# Patient Record
Sex: Female | Born: 1955 | Race: White | Hispanic: No | Marital: Married | Smoking: Former smoker
Health system: Southern US, Community
[De-identification: ages and names within clinical notes are randomized; demographics above are authoritative.]

## PROBLEM LIST (undated history)

## (undated) DIAGNOSIS — I10 Essential (primary) hypertension: Secondary | ICD-10-CM

## (undated) DIAGNOSIS — E7800 Pure hypercholesterolemia, unspecified: Secondary | ICD-10-CM

## (undated) DIAGNOSIS — F419 Anxiety disorder, unspecified: Secondary | ICD-10-CM

## (undated) DIAGNOSIS — K219 Gastro-esophageal reflux disease without esophagitis: Secondary | ICD-10-CM

## (undated) DIAGNOSIS — G039 Meningitis, unspecified: Secondary | ICD-10-CM

## (undated) DIAGNOSIS — E559 Vitamin D deficiency, unspecified: Secondary | ICD-10-CM

## (undated) HISTORY — DX: Anxiety disorder, unspecified: F41.9

## (undated) HISTORY — DX: Essential (primary) hypertension: I10

## (undated) HISTORY — DX: Pure hypercholesterolemia, unspecified: E78.00

## (undated) HISTORY — PX: CHOLECYSTECTOMY: SHX55

## (undated) HISTORY — PX: WISDOM TOOTH EXTRACTION: SHX21

## (undated) HISTORY — DX: Gastro-esophageal reflux disease without esophagitis: K21.9

## (undated) HISTORY — PX: PELVIC FRACTURE SURGERY: SHX119

## (undated) HISTORY — DX: Vitamin D deficiency, unspecified: E55.9

---

## 2020-02-28 ENCOUNTER — Encounter (INDEPENDENT_AMBULATORY_CARE_PROVIDER_SITE_OTHER): Payer: Self-pay | Admitting: *Deleted

## 2020-05-10 ENCOUNTER — Encounter (INDEPENDENT_AMBULATORY_CARE_PROVIDER_SITE_OTHER): Payer: Self-pay

## 2020-05-10 ENCOUNTER — Other Ambulatory Visit: Payer: Self-pay

## 2020-05-10 ENCOUNTER — Telehealth (INDEPENDENT_AMBULATORY_CARE_PROVIDER_SITE_OTHER): Payer: Self-pay

## 2020-05-10 ENCOUNTER — Ambulatory Visit (INDEPENDENT_AMBULATORY_CARE_PROVIDER_SITE_OTHER): Payer: Self-pay | Admitting: Gastroenterology

## 2020-05-10 ENCOUNTER — Ambulatory Visit (INDEPENDENT_AMBULATORY_CARE_PROVIDER_SITE_OTHER): Admitting: Gastroenterology

## 2020-05-10 ENCOUNTER — Encounter (INDEPENDENT_AMBULATORY_CARE_PROVIDER_SITE_OTHER): Payer: Self-pay | Admitting: Gastroenterology

## 2020-05-10 ENCOUNTER — Other Ambulatory Visit (INDEPENDENT_AMBULATORY_CARE_PROVIDER_SITE_OTHER): Payer: Self-pay

## 2020-05-10 DIAGNOSIS — K219 Gastro-esophageal reflux disease without esophagitis: Secondary | ICD-10-CM | POA: Insufficient documentation

## 2020-05-10 DIAGNOSIS — R070 Pain in throat: Secondary | ICD-10-CM | POA: Diagnosis not present

## 2020-05-10 DIAGNOSIS — R1310 Dysphagia, unspecified: Secondary | ICD-10-CM | POA: Insufficient documentation

## 2020-05-10 MED ORDER — PEG 3350-KCL-NA BICARB-NACL 420 G PO SOLR: 4000.0000 mL | ORAL | 0 refills | Status: DC

## 2020-05-10 NOTE — Progress Notes (Signed)
Angela Mullins, M.D. Gastroenterology & Hepatology [facility] [address] [place], [place] [address] Primary Care Physician: Coolidge Breeze, FNP [place] [address] [place] [id number]  Referring MD: PCP  Chief Complaint: Dysphagia, throat discomfort and GERD  History of Present Illness. Angela Mullins is a 65 y.o. female with PMH anxiety, HTN, HLD, GERD, who presents for evaluation of dysphagia, throat discomfort and GERD.  Patient reports that since she was a teenager she has felt recurrent episodes of dysphagia when swallowing pills, very occasionally when swallowing solid food.  She reported that she tries to chew very well reported to avoid having any choking episodes.  However, she reported that she feels "strangled" when food goes occasionally as if there was some sort of pressure in her throat.  Does not have any symptoms when swallowing liquids.  More recently, for the last year she has presented episodes of feeling a foreign body in her throat as "if something was lodged in her throat".  She constantly clears her throat but reports no phlegm comes out.  Patient also reports having GERD and heartburn for the last 30 years.  She was initially put on Prilosec 20 mg every day but since she has persistent symptoms she was initially increased to 40 mg every day.  Reports that this controls her heartburn adequately as she rarely has episodes of burning sensation but she has constant regurgitation through the week, up to 5 times per week.  She tries to raise the head of the bed to decrease the symptoms.  She reports that when she has the episodes of regurgitation she takes Tums or Mylanta with some improvement.  The regurgitation is even worse when she takes coffee.  Patient reports significant history of anxiety and stress.  The patient denies having any odynophagia, nausea, vomiting, fever, chills, hematochezia, melena, hematemesis, abdominal  distention, abdominal pain, diarrhea, jaundice, pruritus or weight loss.  In fact, she has gained weight recently.  Last MWN:UUVOZ Last Colonoscopy:never  FHx: neg for any gastrointestinal/liver disease, aunts breast cancer Social: quit smoking 1 year ago, neg alcohol or illicit drug use Surgical: cholecystectomy  Past Medical History: Past Medical History:  Diagnosis Date  . Anxiety   . GERD (gastroesophageal reflux disease)   . Hypercholesteremia   . Hypertension   . Vitamin D deficiency     Past Surgical History: Past Surgical History:  Procedure Laterality Date  . CHOLECYSTECTOMY    . PELVIC FRACTURE SURGERY    . WISDOM TOOTH EXTRACTION      Family History: Family History  Problem Relation Age of Onset  . Alzheimer's disease Mother   . Heart disease Father   . Diabetes Sister   . Heart disease Sister   . Hypertension Brother     Social History: Social History   Tobacco Use  Smoking Status Former Smoker  Smokeless Tobacco Never Used  Tobacco Comment   Quit 2021   Social History   Substance and Sexual Activity  Alcohol Use Yes   Comment: occasionally   Social History   Substance and Sexual Activity  Drug Use Never    Allergies: Allergies  Allergen Reactions  . Quinine Derivatives Nausea And Vomiting    Medications: Current Outpatient Medications  Medication Sig Dispense Refill  . aspirin EC 81 MG tablet Take 81 mg by mouth daily. Swallow whole.    Marland Kitchen atorvastatin (LIPITOR) 80 MG tablet Take 80 mg by mouth daily.    Marland Kitchen  cetirizine (ZYRTEC ALLERGY) 10 MG tablet Take 10 mg by mouth daily.    . clonazePAM (KLONOPIN) 0.5 MG tablet Take 0.5 mg by mouth 2 (two) times daily as needed for anxiety.    . ergocalciferol (VITAMIN D2) 1.25 MG (50000 UT) capsule Take 50,000 Units by mouth once a week.    . fenofibrate micronized (LOFIBRA) 200 MG capsule Take 200 mg by mouth daily before breakfast.    . ibuprofen (ADVIL) 200 MG tablet Take 200 mg by mouth every  6 (six) hours as needed.    . lisinopril (ZESTRIL) 10 MG tablet Take 10 mg by mouth daily.    . Multiple Vitamins-Minerals (MULTIVITAMIN WITH MINERALS) tablet Take 1 tablet by mouth daily.    . omeprazole (PRILOSEC) 40 MG capsule Take 40 mg by mouth daily.    . Potassium 99 MG TABS Take by mouth daily.     No current facility-administered medications for this visit.    Review of Systems: GENERAL: negative for malaise, night sweats HEENT: No changes in hearing or vision, no nose bleeds or other nasal problems. NECK: Negative for lumps, goiter, pain and significant neck swelling RESPIRATORY: Negative for cough, wheezing CARDIOVASCULAR: Negative for chest pain, leg swelling, palpitations, orthopnea GI: SEE HPI MUSCULOSKELETAL: Negative for joint pain or swelling, back pain, and muscle pain. SKIN: Negative for lesions, rash PSYCH: Negative for sleep disturbance, mood disorder and recent psychosocial stressors. HEMATOLOGY Negative for prolonged bleeding, bruising easily, and swollen nodes. ENDOCRINE: Negative for cold or heat intolerance, polyuria, polydipsia and goiter. NEURO: negative for tremor, gait imbalance, syncope and seizures. The remainder of the review of systems is noncontributory.   Physical Exam: BP 127/82 (BP Location: Left Arm, Patient Position: Sitting, Cuff Size: Small)   Pulse 82   Temp 98.2 F (36.8 C) (Oral)   Ht 5' 0.75" (1.543 m)   Wt 123 lb (55.8 kg)   BMI 23.43 kg/m  GENERAL: The patient is AO x3, in no acute distress. HEENT: Head is normocephalic and atraumatic. EOMI are intact. Mouth is well hydrated and without lesions. NECK: Supple. No masses LUNGS: Clear to auscultation. No presence of rhonchi/wheezing/rales. Adequate chest expansion HEART: RRR, normal s1 and s2. ABDOMEN: Soft, nontender, no guarding, no peritoneal signs, and nondistended. BS +. No masses. EXTREMITIES: Without any cyanosis, clubbing, rash, lesions or edema. NEUROLOGIC: AOx3, no focal  motor deficit. SKIN: no jaundice, no rashes   Imaging/Labs: as above  I personally reviewed and interpreted the available labs, imaging and endoscopic files.  Impression and Plan: Angela Mckeithan is a 65 y.o. female with PMH anxiety, HTN, HLD, GERD, who presents for evaluation of dysphagia, throat discomfort and GERD.  Regarding her dysphagia" discomfort, this will need to be further evaluated with an EGD and modified barium swallow as she has presented the symptoms chronically.  It is unclear if her symptoms are oropharyngeal and esophageal and this will help clarify this further.  She may require to have a dilation during her endoscopy which I explained to her.  Part of her symptoms could also be related to GERD, I recommended to increase the dose of omeprazole to 40 mg twice a day 30 minutes before each meal.  I explained to her that this fails to improve her symptoms and if the endoscopic investigation is unremarkable, she may need to have further testing with a pH impedance testing and manometry.  Finally, we will schedule her for a colonoscopy as she has not had any type of colorectal cancer screening   in the past.  - Schedule EGD with ED and colonoscopy - Increase omeprazole 40 mg twice a day - Explained presumed etiology of reflux symptoms. Instruction provided in the use of antireflux medication - patient should take medication in the morning 30-45 minutes before eating breakfast. Discussed avoidance of eating within 2 hours of lying down to sleep and benefit of blocks to elevate head of bed. - Schedule modified barium swallow  All questions were answered.      Allex Lapoint Castaneda, MD Gastroenterology and Hepatology [facility]  

## 2020-05-10 NOTE — H&P (View-Only) (Signed)
Angela Mullins, M.D. Gastroenterology & Hepatology [facility] [address] [place], [place] [address] Primary Care Physician: Coolidge Breeze, FNP [place] [address] [place] [address]  Referring MD: PCP  Chief Complaint: Dysphagia, throat discomfort and GERD  History of Present Illness. Angela Mullins is a 65 y.o. female with PMH anxiety, HTN, HLD, GERD, who presents for evaluation of dysphagia, throat discomfort and GERD.  Patient reports that since she was a teenager she has felt recurrent episodes of dysphagia when swallowing pills, very occasionally when swallowing solid food.  She reported that she tries to chew very well reported to avoid having any choking episodes.  However, she reported that she feels "strangled" when food goes occasionally as if there was some sort of pressure in her throat.  Does not have any symptoms when swallowing liquids.  More recently, for the last year she has presented episodes of feeling a foreign body in her throat as "if something was lodged in her throat".  She constantly clears her throat but reports no phlegm comes out.  Patient also reports having GERD and heartburn for the last 30 years.  She was initially put on Prilosec 20 mg every day but since she has persistent symptoms she was initially increased to 40 mg every day.  Reports that this controls her heartburn adequately as she rarely has episodes of burning sensation but she has constant regurgitation through the week, up to 5 times per week.  She tries to raise the head of the bed to decrease the symptoms.  She reports that when she has the episodes of regurgitation she takes Tums or Mylanta with some improvement.  The regurgitation is even worse when she takes coffee.  Patient reports significant history of anxiety and stress.  The patient denies having any odynophagia, nausea, vomiting, fever, chills, hematochezia, melena, hematemesis, abdominal  distention, abdominal pain, diarrhea, jaundice, pruritus or weight loss.  In fact, she has gained weight recently.  Last MWN:UUVOZ Last Colonoscopy:never  FHx: neg for any gastrointestinal/liver disease, aunts breast cancer Social: quit smoking 1 year ago, neg alcohol or illicit drug use Surgical: cholecystectomy  Past Medical History: Past Medical History:  Diagnosis Date  . Anxiety   . GERD (gastroesophageal reflux disease)   . Hypercholesteremia   . Hypertension   . Vitamin D deficiency     Past Surgical History: Past Surgical History:  Procedure Laterality Date  . CHOLECYSTECTOMY    . PELVIC FRACTURE SURGERY    . WISDOM TOOTH EXTRACTION      Family History: Family History  Problem Relation Age of Onset  . Alzheimer's disease Mother   . Heart disease Father   . Diabetes Sister   . Heart disease Sister   . Hypertension Brother     Social History: Social History   Tobacco Use  Smoking Status Former Smoker  Smokeless Tobacco Never Used  Tobacco Comment   Quit 2021   Social History   Substance and Sexual Activity  Alcohol Use Yes   Comment: occasionally   Social History   Substance and Sexual Activity  Drug Use Never    Allergies: Allergies  Allergen Reactions  . Quinine Derivatives Nausea And Vomiting    Medications: Current Outpatient Medications  Medication Sig Dispense Refill  . aspirin EC 81 MG tablet Take 81 mg by mouth daily. Swallow whole.    Marland Kitchen atorvastatin (LIPITOR) 80 MG tablet Take 80 mg by mouth daily.    Marland Kitchen  cetirizine (ZYRTEC ALLERGY) 10 MG tablet Take 10 mg by mouth daily.    . clonazePAM (KLONOPIN) 0.5 MG tablet Take 0.5 mg by mouth 2 (two) times daily as needed for anxiety.    . ergocalciferol (VITAMIN D2) 1.25 MG (50000 UT) capsule Take 50,000 Units by mouth once a week.    . fenofibrate micronized (LOFIBRA) 200 MG capsule Take 200 mg by mouth daily before breakfast.    . ibuprofen (ADVIL) 200 MG tablet Take 200 mg by mouth every  6 (six) hours as needed.    Marland Kitchen lisinopril (ZESTRIL) 10 MG tablet Take 10 mg by mouth daily.    . Multiple Vitamins-Minerals (MULTIVITAMIN WITH MINERALS) tablet Take 1 tablet by mouth daily.    Marland Kitchen omeprazole (PRILOSEC) 40 MG capsule Take 40 mg by mouth daily.    . Potassium 99 MG TABS Take by mouth daily.     No current facility-administered medications for this visit.    Review of Systems: GENERAL: negative for malaise, night sweats HEENT: No changes in hearing or vision, no nose bleeds or other nasal problems. NECK: Negative for lumps, goiter, pain and significant neck swelling RESPIRATORY: Negative for cough, wheezing CARDIOVASCULAR: Negative for chest pain, leg swelling, palpitations, orthopnea GI: SEE HPI MUSCULOSKELETAL: Negative for joint pain or swelling, back pain, and muscle pain. SKIN: Negative for lesions, rash PSYCH: Negative for sleep disturbance, mood disorder and recent psychosocial stressors. HEMATOLOGY Negative for prolonged bleeding, bruising easily, and swollen nodes. ENDOCRINE: Negative for cold or heat intolerance, polyuria, polydipsia and goiter. NEURO: negative for tremor, gait imbalance, syncope and seizures. The remainder of the review of systems is noncontributory.   Physical Exam: BP 127/82 (BP Location: Left Arm, Patient Position: Sitting, Cuff Size: Small)   Pulse 82   Temp 98.2 F (36.8 C) (Oral)   Ht 5' 0.75" (1.543 m)   Wt 123 lb (55.8 kg)   BMI 23.43 kg/m  GENERAL: The patient is AO x3, in no acute distress. HEENT: Head is normocephalic and atraumatic. EOMI are intact. Mouth is well hydrated and without lesions. NECK: Supple. No masses LUNGS: Clear to auscultation. No presence of rhonchi/wheezing/rales. Adequate chest expansion HEART: RRR, normal s1 and s2. ABDOMEN: Soft, nontender, no guarding, no peritoneal signs, and nondistended. BS +. No masses. EXTREMITIES: Without any cyanosis, clubbing, rash, lesions or edema. NEUROLOGIC: AOx3, no focal  motor deficit. SKIN: no jaundice, no rashes   Imaging/Labs: as above  I personally reviewed and interpreted the available labs, imaging and endoscopic files.  Impression and Plan: Ilona Colley is a 65 y.o. female with PMH anxiety, HTN, HLD, GERD, who presents for evaluation of dysphagia, throat discomfort and GERD.  Regarding her dysphagia" discomfort, this will need to be further evaluated with an EGD and modified barium swallow as she has presented the symptoms chronically.  It is unclear if her symptoms are oropharyngeal and esophageal and this will help clarify this further.  She may require to have a dilation during her endoscopy which I explained to her.  Part of her symptoms could also be related to GERD, I recommended to increase the dose of omeprazole to 40 mg twice a day 30 minutes before each meal.  I explained to her that this fails to improve her symptoms and if the endoscopic investigation is unremarkable, she may need to have further testing with a pH impedance testing and manometry.  Finally, we will schedule her for a colonoscopy as she has not had any type of colorectal cancer screening  in the past.  - Schedule EGD with ED and colonoscopy - Increase omeprazole 40 mg twice a day - Explained presumed etiology of reflux symptoms. Instruction provided in the use of antireflux medication - patient should take medication in the morning 30-45 minutes before eating breakfast. Discussed avoidance of eating within 2 hours of lying down to sleep and benefit of blocks to elevate head of bed. - Schedule modified barium swallow  All questions were answered.      Angela Peppers, MD Gastroenterology and Hepatology [facility]

## 2020-05-10 NOTE — Patient Instructions (Signed)
Schedule EGD with ED and colonoscopy Increase omeprazole 40 mg twice a day Explained presumed etiology of reflux symptoms. Instruction provided in the use of antireflux medication - patient should take medication in the morning 30-45 minutes before eating breakfast. Discussed avoidance of eating within 2 hours of lying down to sleep and benefit of blocks to elevate head of bed. Schedule modified barium swallow

## 2020-05-10 NOTE — Telephone Encounter (Signed)
LeighAnn Sandip Power, CMA  

## 2020-05-11 ENCOUNTER — Other Ambulatory Visit (INDEPENDENT_AMBULATORY_CARE_PROVIDER_SITE_OTHER): Payer: Self-pay

## 2020-05-16 ENCOUNTER — Other Ambulatory Visit (HOSPITAL_COMMUNITY): Payer: Self-pay | Admitting: Specialist

## 2020-05-16 DIAGNOSIS — R1319 Other dysphagia: Secondary | ICD-10-CM

## 2020-05-18 ENCOUNTER — Encounter (HOSPITAL_COMMUNITY): Payer: Self-pay | Admitting: Speech Pathology

## 2020-05-18 ENCOUNTER — Ambulatory Visit (HOSPITAL_COMMUNITY)
Admission: RE | Admit: 2020-05-18 | Discharge: 2020-05-18 | Disposition: A | Discharge status: Home / Self Care | Source: Ambulatory Visit | Attending: Gastroenterology | Admitting: Gastroenterology

## 2020-05-18 ENCOUNTER — Ambulatory Visit (HOSPITAL_COMMUNITY): Attending: Gastroenterology | Admitting: Speech Pathology

## 2020-05-18 ENCOUNTER — Other Ambulatory Visit: Payer: Self-pay

## 2020-05-18 DIAGNOSIS — R1319 Other dysphagia: Secondary | ICD-10-CM

## 2020-05-18 DIAGNOSIS — R1312 Dysphagia, oropharyngeal phase: Secondary | ICD-10-CM | POA: Diagnosis not present

## 2020-05-18 NOTE — Therapy (Signed)
[place], [place], [address] Phone: [phone]   Fax:  [phone]  Modified Barium Swallow  Patient Details  Name: Angela Mullins MRN: [id number] Date of Birth: 1955/03/28 No data recorded  Encounter Date: 05/18/2020   End of Session - 05/18/20 1254    Visit Number 1    Number of Visits 1    Authorization Type [employer]    SLP Start Time 3500    SLP Stop Time  1215    SLP Time Calculation (min) 30 min    Activity Tolerance Patient tolerated treatment well           Past Medical History:  Diagnosis Date  . Anxiety   . GERD (gastroesophageal reflux disease)   . Hypercholesteremia   . Hypertension   . Vitamin D deficiency     Past Surgical History:  Procedure Laterality Date  . CHOLECYSTECTOMY    . PELVIC FRACTURE SURGERY    . WISDOM TOOTH EXTRACTION      There were no vitals filed for this visit.   Subjective Assessment - 05/18/20 1247    Subjective "I have to throw my head back to swallow pills."    Special Tests MBSS    Currently in Pain? No/denies               General - 05/18/20 1248      General Information   Date of Onset 05/10/20    HPI Angela Mullins is a 65 yo female who was referred by Dr. Maylon Peppers for MBSS due to Pt with reports of dysphagia. Pt with PMH significant for anxiety, HTN, HLD, and GERD. Patient reports that since she was a teenager she has felt recurrent episodes of dysphagia when swallowing pills, very occasionally when swallowing solid food.  She reported that she tries to chew very well reported to avoid having any choking episodes.  However, she reported that she feels "strangled" when food goes occasionally as if there was some sort of pressure in her throat.  Does not have any symptoms when swallowing liquids.  More recently, for the last year she has presented episodes of feeling a foreign body in her throat as "if something was lodged in her throat".  She  constantly clears her throat but reports no phlegm comes out. She quit smoking last year. Her Prilosec was increased to 40 mg twice per day last week. She does not sleep with the HOB elevated, but uses a wedge. No recent reports of PNA.    Type of Study MBS-Modified Barium Swallow Study    Previous Swallow Assessment None on record    Diet Prior to this Study Regular;Thin liquids    Temperature Spikes Noted No    Respiratory Status Room air    History of Recent Intubation No    Behavior/Cognition Alert;Cooperative;Pleasant mood    Oral Cavity Assessment Within Functional Limits    Oral Care Completed by SLP No    Oral Cavity - Dentition Adequate natural dentition    Vision Functional for self feeding    Self-Feeding Abilities Able to feed self    Patient Positioning Upright in chair    Baseline Vocal Quality Normal    Volitional Cough Strong    Volitional Swallow Able to elicit    Anatomy Within functional limits    Pharyngeal Secretions Not observed secondary MBS              Oral Preparation/Oral Phase - 05/18/20  1248      Oral Preparation/Oral Phase   Oral Phase Impaired      Oral - Thin   Oral - Thin Teaspoon Within functional limits    Oral - Thin Cup Within functional limits      Oral - Solids   Oral - Puree Within functional limits    Oral - Regular Within functional limits    Oral - Pill Within functional limits      Electrical stimulation - Oral Phase   Was Electrical Stimulation Used No            Pharyngeal Phase - 05/18/20 1250      Pharyngeal Phase   Pharyngeal Phase Within functional limits            Cricopharyngeal Phase - 05/18/20 1250      Cervical Esophageal Phase   Cervical Esophageal Phase Impaired      Cervical Esophageal Phase - Comment   Cervical Esophageal Comment Pt points to ~C7 as source of globus with solids and pill    Other Esophageal Phase Observations Pt with delayed esophageal transit, incomplete clearance with primary  wave, stasis of pill near LES which cleared with puree wash (did not clear with thin wash)                    Plan - 05/18/20 1254    Clinical Impression Statement MBSS completed. Pt assessed with barium tinged thin, puree, regular textures, and barium tablet. Oropharyngeal swallow is WNL with the exception of Pt producing effortful swallows with solids, however suspect this is due to esophageal component. Swallow initiation was timely and hyolaryngeal excursion WNL, no penetration/aspiration or significant pharyngeal residuals post swallow. Esophageal sweep completed before and after presentation of barium table revealed incomplete clearance of barium via primary wave. The barium tablet became transiently delayed near the LES and was not cleared by liquid wash, but was cleared with a bite of puree. Pt reports that she typically takes larger pills balled up into small pieces of bread to facilitate swallow. Pt's reports of globus, effortful swallows, non productive coughing, and regurgitation appear more consistent with esophageal dysphagia. She was recently increased to prilosec 2x per day. Pt was encouraged to alternate solids and liquids, adhere to reflux precautions, and elevate the head of her bed. No further SLP services indicated at this time. Pt has EGD scheduled for Tuesday with Dr. Jenetta Downer.    Consulted and Agree with Plan of Care Patient           Patient will benefit from skilled therapeutic intervention in order to improve the following deficits and impairments:   Dysphagia, oropharyngeal phase     Recommendations/Treatment - 05/18/20 1252      Swallow Evaluation Recommendations   Recommended Consults Consider esophageal assessment    SLP Diet Recommendations Age appropriate regular;Thin    Liquid Administration via Cup    Medication Administration Whole meds with liquid    Supervision Patient able to self feed    Compensations Follow solids with liquid    Postural  Changes Seated upright at 90 degrees;Remain upright for at least 30 minutes after feeds/meals            Prognosis - 05/18/20 1253      Prognosis   Prognosis for Safe Diet Advancement Good    Barriers/Prognosis Comment suspected primary  esophageal dysphagia      Individuals Consulted   Consulted and Agree with Results and Recommendations Patient  Report Sent to  Referring physician           Problem List Patient Active Problem List   Diagnosis Date Noted  . Dysphagia 05/10/2020  . Throat discomfort 05/10/2020  . GERD (gastroesophageal reflux disease) 05/10/2020   Thank you,  Genene Churn, [place]  [place] 05/18/2020, 1:01 PM  [place], [place], [address] Phone: [phone]   Fax:  [phone]  Name: Angela Mullins MRN: [id number] Date of Birth: 21-Feb-1955

## 2020-05-19 ENCOUNTER — Ambulatory Visit (HOSPITAL_COMMUNITY): Admitting: Speech Pathology

## 2020-05-22 ENCOUNTER — Other Ambulatory Visit (HOSPITAL_COMMUNITY)

## 2020-05-22 ENCOUNTER — Other Ambulatory Visit (HOSPITAL_COMMUNITY)
Admission: RE | Admit: 2020-05-22 | Discharge: 2020-05-22 | Disposition: A | Discharge status: Home / Self Care | Source: Ambulatory Visit | Attending: Gastroenterology | Admitting: Gastroenterology

## 2020-05-22 ENCOUNTER — Other Ambulatory Visit: Payer: Self-pay

## 2020-05-22 DIAGNOSIS — Z01812 Encounter for preprocedural laboratory examination: Secondary | ICD-10-CM | POA: Diagnosis present

## 2020-05-22 DIAGNOSIS — Z20822 Contact with and (suspected) exposure to covid-19: Secondary | ICD-10-CM | POA: Insufficient documentation

## 2020-05-22 LAB — SARS CORONAVIRUS 2 (TAT 6-24 HRS): SARS Coronavirus 2: NEGATIVE

## 2020-05-23 ENCOUNTER — Ambulatory Visit (HOSPITAL_COMMUNITY): Admitting: Anesthesiology

## 2020-05-23 ENCOUNTER — Encounter (HOSPITAL_COMMUNITY)
Admission: RE | Disposition: A | Discharge status: Home / Self Care | Payer: Self-pay | Source: Home / Self Care | Attending: Gastroenterology

## 2020-05-23 ENCOUNTER — Ambulatory Visit (HOSPITAL_COMMUNITY)
Admission: RE | Admit: 2020-05-23 | Discharge: 2020-05-23 | Disposition: A | Discharge status: Home / Self Care | Attending: Gastroenterology | Admitting: Gastroenterology

## 2020-05-23 ENCOUNTER — Encounter (HOSPITAL_COMMUNITY): Payer: Self-pay | Admitting: Gastroenterology

## 2020-05-23 ENCOUNTER — Other Ambulatory Visit: Payer: Self-pay

## 2020-05-23 DIAGNOSIS — Z7982 Long term (current) use of aspirin: Secondary | ICD-10-CM | POA: Insufficient documentation

## 2020-05-23 DIAGNOSIS — D123 Benign neoplasm of transverse colon: Secondary | ICD-10-CM | POA: Diagnosis not present

## 2020-05-23 DIAGNOSIS — Z888 Allergy status to other drugs, medicaments and biological substances status: Secondary | ICD-10-CM | POA: Insufficient documentation

## 2020-05-23 DIAGNOSIS — R1310 Dysphagia, unspecified: Secondary | ICD-10-CM | POA: Insufficient documentation

## 2020-05-23 DIAGNOSIS — F458 Other somatoform disorders: Secondary | ICD-10-CM

## 2020-05-23 DIAGNOSIS — F419 Anxiety disorder, unspecified: Secondary | ICD-10-CM | POA: Insufficient documentation

## 2020-05-23 DIAGNOSIS — K5730 Diverticulosis of large intestine without perforation or abscess without bleeding: Secondary | ICD-10-CM | POA: Insufficient documentation

## 2020-05-23 DIAGNOSIS — K648 Other hemorrhoids: Secondary | ICD-10-CM | POA: Insufficient documentation

## 2020-05-23 DIAGNOSIS — K219 Gastro-esophageal reflux disease without esophagitis: Secondary | ICD-10-CM | POA: Diagnosis not present

## 2020-05-23 DIAGNOSIS — Z1211 Encounter for screening for malignant neoplasm of colon: Secondary | ICD-10-CM | POA: Diagnosis not present

## 2020-05-23 DIAGNOSIS — Z87891 Personal history of nicotine dependence: Secondary | ICD-10-CM | POA: Insufficient documentation

## 2020-05-23 DIAGNOSIS — Z79899 Other long term (current) drug therapy: Secondary | ICD-10-CM | POA: Diagnosis not present

## 2020-05-23 DIAGNOSIS — D122 Benign neoplasm of ascending colon: Secondary | ICD-10-CM | POA: Insufficient documentation

## 2020-05-23 DIAGNOSIS — K3189 Other diseases of stomach and duodenum: Secondary | ICD-10-CM

## 2020-05-23 DIAGNOSIS — D124 Benign neoplasm of descending colon: Secondary | ICD-10-CM | POA: Insufficient documentation

## 2020-05-23 HISTORY — PX: ESOPHAGEAL DILATION: SHX303

## 2020-05-23 HISTORY — PX: POLYPECTOMY: SHX5525

## 2020-05-23 HISTORY — PX: COLONOSCOPY WITH PROPOFOL: SHX5780

## 2020-05-23 HISTORY — PX: ESOPHAGOGASTRODUODENOSCOPY (EGD) WITH PROPOFOL: SHX5813

## 2020-05-23 HISTORY — PX: BIOPSY: SHX5522

## 2020-05-23 LAB — HM COLONOSCOPY

## 2020-05-23 SURGERY — COLONOSCOPY WITH PROPOFOL
Anesthesia: General

## 2020-05-23 MED ORDER — EPHEDRINE SULFATE 50 MG/ML IJ SOLN
INTRAMUSCULAR | Status: DC | PRN
  Administered 2020-05-23 (×2): 10 mg via INTRAVENOUS

## 2020-05-23 MED ORDER — LACTATED RINGERS IV SOLN: INTRAVENOUS | Status: DC

## 2020-05-23 MED ORDER — EPHEDRINE 5 MG/ML INJ
INTRAVENOUS | Status: AC
  Filled 2020-05-23: qty 10

## 2020-05-23 MED ORDER — PROPOFOL 1000 MG/100ML IV EMUL
INTRAVENOUS | Status: AC
  Filled 2020-05-23: qty 100

## 2020-05-23 MED ORDER — PROPOFOL 500 MG/50ML IV EMUL
INTRAVENOUS | Status: DC | PRN
  Administered 2020-05-23: 150 ug/kg/min via INTRAVENOUS

## 2020-05-23 MED ORDER — LIDOCAINE HCL (CARDIAC) PF 100 MG/5ML IV SOSY
PREFILLED_SYRINGE | INTRAVENOUS | Status: DC | PRN
  Administered 2020-05-23: 60 mg via INTRAVENOUS

## 2020-05-23 MED ORDER — PROPOFOL 10 MG/ML IV BOLUS
INTRAVENOUS | Status: DC | PRN
  Administered 2020-05-23: 50 mg via INTRAVENOUS

## 2020-05-23 NOTE — Op Note (Signed)
[facility] Patient Name: Angela Mullins Procedure Date: 05/23/2020 11:04 AM MRN: [id number] Date of Birth: 08-11-1955 Attending MD: Maylon Peppers ,  CSN: [id number] Age: 65 Admit Type: Outpatient Procedure:                Upper GI endoscopy Indications:              Dysphagia, Esophageal reflux, Globus sensation Providers:                Maylon Peppers, Jeanann Lewandowsky. Sharon Seller, RN, Nelma Rothman, Technician Referring MD:              Medicines:                Monitored Anesthesia Care Complications:            No immediate complications. Estimated Blood Loss:     Estimated blood loss: none. Procedure:                Pre-Anesthesia Assessment:                           - Prior to the procedure, a History and Physical                            was performed, and patient medications, allergies                            and sensitivities were reviewed. The patient's                            tolerance of previous anesthesia was reviewed.                           - The risks and benefits of the procedure and the                            sedation options and risks were discussed with the                            patient. All questions were answered and informed                            consent was obtained.                           - ASA Grade Assessment: II - A patient with mild                            systemic disease.                           After obtaining informed consent, the endoscope was                            passed under direct vision. Throughout the  procedure, the patient's blood pressure, pulse, and                            oxygen saturations were monitored continuously. The                            [id number]-H190 ([id number]) scope was introduced through the                            mouth, and advanced to the second part of duodenum.                            The upper GI endoscopy was accomplished without                             difficulty. The patient tolerated the procedure                            well. Scope In: 11:09:29 AM Scope Out: 11:31:26 AM Total Procedure Duration: 0 hours 21 minutes 57 seconds  Findings:      No endoscopic abnormality was evident in the esophagus to explain the       patient's complaint of dysphagia. It was decided, however, to proceed       with dilation of the entire esophagus. A guidewire was placed and the       scope was withdrawn. Dilation was performed with a Savary dilator with       mild resistance at 16 mm and moderate resistance at 18 mm. Upon       reinspection, there was presence of hematin in the esophagus with       presence of mucosal disruption in the upper third of the esophagus and a       linear longer mucosal break in the lower third of the esophagus.       Biopsies were obtained from the proximal and distal esophagus with cold       forceps for histology of suspected eosinophilic esophagitis.      Four localized spots with no bleeding and no stigmata of recent bleeding       were found in the gastric antrum. Imaging was performed using white       light and narrow band imaging to visualize the mucosa. These areas were       concerning for gastric intestinal metaplasia. Biopsies were taken with a       cold forceps for histology.      The examined duodenum was normal. Impression:               - No endoscopic esophageal abnormality to explain                            patient's dysphagia. Esophagus dilated. Dilated.                            Biopsied.                           - Four spots with no bleeding and no stigmata of  recent bleeding in the stomach. Biopsied.                           - Normal examined duodenum. Moderate Sedation:      Per Anesthesia Care Recommendation:           - Discharge patient to home (ambulatory).                           - Full liquid diet today, advance to regular diet                             tomorrow.                           - Await pathology results.                           - Continue present medications, including                            omeprazole.                           - If recurrent/persistent symptoms, please call                            back to the office to schedule esophageal manometry                            and pH/impedance study. Procedure Code(s):        --- Professional ---                           [phone], Esophagogastroduodenoscopy, flexible,                            transoral; with insertion of guide wire followed by                            passage of dilator(s) through esophagus over guide                            wire                           [id number], 87, Esophagogastroduodenoscopy, flexible,                            transoral; with biopsy, single or multiple Diagnosis Code(s):        --- Professional ---                           R13.10, Dysphagia, unspecified                           K31.89, Other diseases of stomach and duodenum  K21.9, Gastro-esophageal reflux disease without                            esophagitis                           F45.8, Other somatoform disorders CPT copyright 2019 [employer]. All rights reserved. The codes documented in this report are preliminary and upon coder review may  be revised to meet current compliance requirements. Maylon Peppers, MD Maylon Peppers,  05/23/2020 11:38:50 AM This report has been signed electronically. Number of Addenda: 0

## 2020-05-23 NOTE — Anesthesia Postprocedure Evaluation (Signed)
Anesthesia Post Note  Patient: Angela Mullins  Procedure(s) Performed: COLONOSCOPY WITH PROPOFOL (N/A ) ESOPHAGOGASTRODUODENOSCOPY (EGD) WITH PROPOFOL (N/A ) ESOPHAGEAL DILATION (N/A ) BIOPSY POLYPECTOMY  Patient location during evaluation: Endoscopy Anesthesia Type: General Level of consciousness: awake and alert and oriented Pain management: pain level controlled Vital Signs Assessment: post-procedure vital signs reviewed and stable Respiratory status: spontaneous breathing and respiratory function stable Cardiovascular status: blood pressure returned to baseline and stable Postop Assessment: no apparent nausea or vomiting Anesthetic complications: no   No complications documented.   Last Vitals:  Vitals:   05/23/20 0934 05/23/20 1219  BP: 137/79 (!) 97/47  Pulse: 93   Resp: (!) 21 14  Temp: 36.7 C 36.7 C  SpO2: 98% 99%    Last Pain:  Vitals:   05/23/20 1219  TempSrc: Oral  PainSc: 7                  Mettie Roylance C Karlin Heilman

## 2020-05-23 NOTE — Op Note (Addendum)
[facility] Patient Name: Angela Mullins Procedure Date: 05/23/2020 11:36 AM MRN: [id number] Date of Birth: Mar 20, 1955 Attending MD: Maylon Peppers ,  CSN: [id number] Age: 65 Admit Type: Outpatient Procedure:                Colonoscopy Indications:              Screening for colorectal malignant neoplasm Providers:                Maylon Peppers, [place] Sharon Seller, RN, Nelma Rothman, Technician Referring MD:              Medicines:                Monitored Anesthesia Care Complications:            No immediate complications. Estimated Blood Loss:     Estimated blood loss: none. Procedure:                Pre-Anesthesia Assessment:                           - Prior to the procedure, a History and Physical                            was performed, and patient medications, allergies                            and sensitivities were reviewed. The patient's                            tolerance of previous anesthesia was reviewed.                           - The risks and benefits of the procedure and the                            sedation options and risks were discussed with the                            patient. All questions were answered and informed                            consent was obtained.                           - ASA Grade Assessment: II - A patient with mild                            systemic disease.                           After obtaining informed consent, the colonoscope                            was passed under direct vision. Throughout the  procedure, the patient's blood pressure, pulse, and                            oxygen saturations were monitored continuously. The                            [id number]-HQ190L ([id number]) scope was introduced through                            the anus and advanced to the the cecum, identified                            by appendiceal orifice and ileocecal valve. The                             colonoscopy was performed without difficulty. The                            patient tolerated the procedure well. The quality                            of the bowel preparation was excellent. Scope In: 11:39:49 AM Scope Out: 12:15:06 PM Scope Withdrawal Time: 0 hours 28 minutes 52 seconds  Total Procedure Duration: 0 hours 35 minutes 17 seconds  Findings:      The perianal and digital rectal examinations were normal.      A 10 mm polyp was found in the proximal ascending colon. The polyp was       semi-sessile, locatesd just distal to the IC valve, in the contralateral       surface. The polyp was removed with a piecemeal technique using a cold       snare. Resection and retrieval were complete.      Four sessile polyps were found in the descending colon, transverse colon       and ascending colon. The polyps were 4 to 8 mm in size. These polyps       were removed with a cold snare. Resection and retrieval were complete.      A few small-mouthed diverticula were found in the sigmoid colon.      Non-bleeding internal hemorrhoids were found during retroflexion. The       hemorrhoids were small. Impression:               - One 10 mm polyp in the proximal ascending colon,                            removed piecemeal using a cold snare. Resected and                            retrieved.                           - Four 4 to 8 mm polyps in the descending colon, in                            the transverse colon and in  the ascending colon,                            removed with a cold snare. Resected and retrieved.                           - Diverticulosis in the sigmoid colon.                           - Non-bleeding internal hemorrhoids. Moderate Sedation:      Per Anesthesia Care Recommendation:           - Discharge patient to home (ambulatory).                           - Resume previous diet.                           - Await pathology results.                            - Repeat colonoscopy in 1 year for surveillance                            after piecemeal polypectomy. then should be                            repeated in at least 5 years unless more polyps are                            seen. Procedure Code(s):        --- Professional ---                           [phone], Colonoscopy, flexible; with removal of                            tumor(s), polyp(s), or other lesion(s) by snare                            technique Diagnosis Code(s):        --- Professional ---                           K63.5, Polyp of colon                           Z12.11, Encounter for screening for malignant                            neoplasm of colon                           K64.8, Other hemorrhoids                           K57.30, Diverticulosis of large intestine without  perforation or abscess without bleeding CPT copyright 2019 [employer]. All rights reserved. The codes documented in this report are preliminary and upon coder review may  be revised to meet current compliance requirements. Maylon Peppers, MD Maylon Peppers,  05/23/2020 12:22:08 PM This report has been signed electronically. Number of Addenda: 0

## 2020-05-23 NOTE — Discharge Instructions (Signed)
You are being discharged to home.  Take a full liquid diet today.  We are waiting for your pathology results.  Continue your present medications including omeprazole.  If recurrent/persistent symptoms, please call back to the office to schedule esophageal manometry and pH/impedance study. Your physician has recommended a repeat colonoscopy in one year for surveillance after today's piecemeal polypectomy, then should be repeated in at least 5 years unless more polyps are seen.     Colonoscopy, Adult, Care After This sheet gives you information about how to care for yourself after your procedure. Your doctor may also give you more specific instructions. If you have problems or questions, call your doctor. What can I expect after the procedure? After the procedure, it is common to have:  A small amount of blood in your poop (stool) for 24 hours.  Some gas.  Mild cramping or bloating in your belly (abdomen). Follow these instructions at home: Eating and drinking  Drink enough fluid to keep your pee (urine) pale yellow.  Follow instructions from your doctor about what you cannot eat or drink.  Return to your normal diet as told by your doctor. Avoid heavy or fried foods that are hard to digest.   Activity  Rest as told by your doctor.  Do not sit for a long time without moving. Get up to take short walks every 1-2 hours. This is important. Ask for help if you feel weak or unsteady.  Return to your normal activities as told by your doctor. Ask your doctor what activities are safe for you. To help cramping and bloating:  Try walking around.  Put heat on your belly as told by your doctor. Use the heat source that your doctor recommends, such as a moist heat pack or a heating pad. ? Put a towel between your skin and the heat source. ? Leave the heat on for 20-30 minutes. ? Remove the heat if your skin turns bright red. This is very important if you are unable to feel pain, heat, or  cold. You may have a greater risk of getting burned.   General instructions  If you were given a medicine to help you relax (sedative) during your procedure, it can affect you for many hours. Do not drive or use machinery until your doctor says that it is safe.  For the first 24 hours after the procedure: ? Do not sign important documents. ? Do not drink alcohol. ? Do your daily activities more slowly than normal. ? Eat foods that are soft and easy to digest.  Take over-the-counter or prescription medicines only as told by your doctor.  Keep all follow-up visits as told by your doctor. This is important. Contact a doctor if:  You have blood in your poop 2-3 days after the procedure. Get help right away if:  You have more than a small amount of blood in your poop.  You see large clumps of tissue (blood clots) in your poop.  Your belly is swollen.  You feel like you may vomit (nauseous).  You vomit.  You have a fever.  You have belly pain that gets worse, and medicine does not help your pain. Summary  After the procedure, it is common to have a small amount of blood in your poop. You may also have mild cramping and bloating in your belly.  If you were given a medicine to help you relax (sedative) during your procedure, it can affect you for many hours. Do not drive  or use machinery until your doctor says that it is safe.  Get help right away if you have a lot of blood in your poop, feel like you may vomit, have a fever, or have more belly pain. This information is not intended to replace advice given to you by your health care provider. Make sure you discuss any questions you have with your health care provider. Document Revised: 11/06/2018 Document Reviewed: 07/27/2018 Elsevier Patient Education  2021 [place].  Diverticulosis  Diverticulosis is a condition that develops when small pouches (diverticula) form in the wall of the large intestine (colon). The colon is  where water is absorbed and stool (feces) is formed. The pouches form when the inside layer of the colon pushes through weak spots in the outer layers of the colon. You may have a few pouches or many of them. The pouches usually do not cause problems unless they become inflamed or infected. When this happens, the condition is called diverticulitis. What are the causes? The cause of this condition is not known. What increases the risk? The following factors may make you more likely to develop this condition:  Being older than age 55. Your risk for this condition increases with age. Diverticulosis is rare among people younger than age 44. By age 54, many people have it.  Eating a low-fiber diet.  Having frequent constipation.  Being overweight.  Not getting enough exercise.  Smoking.  Taking over-the-counter pain medicines, like aspirin and ibuprofen.  Having a family history of diverticulosis. What are the signs or symptoms? In most people, there are no symptoms of this condition. If you do have symptoms, they may include:  Bloating.  Cramps in the abdomen.  Constipation or diarrhea.  Pain in the lower left side of the abdomen. How is this diagnosed? Because diverticulosis usually has no symptoms, it is most often diagnosed during an exam for other colon problems. The condition may be diagnosed by:  Using a flexible scope to examine the colon (colonoscopy).  Taking an X-ray of the colon after dye has been put into the colon (barium enema).  Having a CT scan. How is this treated? You may not need treatment for this condition. Your health care provider may recommend treatment to prevent problems. You may need treatment if you have symptoms or if you previously had diverticulitis. Treatment may include:  Eating a high-fiber diet.  Taking a fiber supplement.  Taking a live bacteria supplement (probiotic).  Taking medicine to relax your colon.   Follow these instructions  at home: Medicines  Take over-the-counter and prescription medicines only as told by your health care provider.  If told by your health care provider, take a fiber supplement or probiotic. Constipation prevention Your condition may cause constipation. To prevent or treat constipation, you may need to:  Drink enough fluid to keep your urine pale yellow.  Take over-the-counter or prescription medicines.  Eat foods that are high in fiber, such as beans, whole grains, and fresh fruits and vegetables.  Limit foods that are high in fat and processed sugars, such as fried or sweet foods.   General instructions  Try not to strain when you have a bowel movement.  Keep all follow-up visits as told by your health care provider. This is important. Contact a health care provider if you:  Have pain in your abdomen.  Have bloating.  Have cramps.  Have not had a bowel movement in 3 days. Get help right away if:  Your pain  gets worse.  Your bloating becomes very bad.  You have a fever or chills, and your symptoms suddenly get worse.  You vomit.  You have bowel movements that are bloody or black.  You have bleeding from your rectum. Summary  Diverticulosis is a condition that develops when small pouches (diverticula) form in the wall of the large intestine (colon).  You may have a few pouches or many of them.  This condition is most often diagnosed during an exam for other colon problems.  Treatment may include increasing the fiber in your diet, taking supplements, or taking medicines. This information is not intended to replace advice given to you by your health care provider. Make sure you discuss any questions you have with your health care provider. Document Revised: 07/30/2018 Document Reviewed: 07/30/2018 Elsevier Patient Education  [place].  Colon Polyps  Colon polyps are tissue growths inside the colon, which is part of the large intestine. They are one of  the types of polyps that can grow in the body. A polyp may be a round bump or a mushroom-shaped growth. You could have one polyp or more than one. Most colon polyps are noncancerous (benign). However, some colon polyps can become cancerous over time. Finding and removing the polyps early can help prevent this. What are the causes? The exact cause of colon polyps is not known. What increases the risk? The following factors may make you more likely to develop this condition:  Having a family history of colorectal cancer or colon polyps.  Being older than 63 years of age.  Being younger than 63 years of age and having a significant family history of colorectal cancer or colon polyps or a genetic condition that puts you at higher risk of getting colon polyps.  Having inflammatory bowel disease, such as ulcerative colitis or Crohn's disease.  Having certain conditions passed from parent to child (hereditary conditions), such as: ? Familial adenomatous polyposis (FAP). ? Lynch syndrome. ? Turcot syndrome. ? Peutz-Jeghers syndrome. ? MUTYH-associated polyposis (MAP).  Being overweight.  Certain lifestyle factors. These include smoking cigarettes, drinking too much alcohol, not getting enough exercise, and eating a diet that is high in fat and red meat and low in fiber.  Having had childhood cancer that was treated with radiation of the abdomen. What are the signs or symptoms? Many times, there are no symptoms. If you have symptoms, they may include:  Blood coming from the rectum during a bowel movement.  Blood in the stool (feces). The blood may be bright red or very dark in color.  Pain in the abdomen.  A change in bowel habits, such as constipation or diarrhea. How is this diagnosed? This condition is diagnosed with a colonoscopy. This is a procedure in which a lighted, flexible scope is inserted into the opening between the buttocks (anus) and then passed into the colon to examine  the area. Polyps are sometimes found when a colonoscopy is done as part of routine cancer screening tests. How is this treated? This condition is treated by removing any polyps that are found. Most polyps can be removed during a colonoscopy. Those polyps will then be tested for cancer. Additional treatment may be needed depending on the results of testing. Follow these instructions at home: Eating and drinking  Eat foods that are high in fiber, such as fruits, vegetables, and whole grains.  Eat foods that are high in calcium and vitamin D, such as milk, cheese, yogurt, eggs, liver, fish, and  broccoli.  Limit foods that are high in fat, such as fried foods and desserts.  Limit the amount of red meat, precooked or cured meat, or other processed meat that you eat, such as hot dogs, sausages, bacon, or meat loaves.  Limit sugary drinks.   Lifestyle  Maintain a healthy weight, or lose weight if recommended by your health care provider.  Exercise every day or as told by your health care provider.  Do not use any products that contain nicotine or tobacco, such as cigarettes, e-cigarettes, and chewing tobacco. If you need help quitting, ask your health care provider.  Do not drink alcohol if: ? Your health care provider tells you not to drink. ? You are pregnant, may be pregnant, or are planning to become pregnant.  If you drink alcohol: ? Limit how much you use to:  0-1 drink a day for women.  0-2 drinks a day for men. ? Know how much alcohol is in your drink. In the [place] one drink equals one 12 oz bottle of beer (355 mL), one 5 oz glass of wine (148 mL), or one 1 oz glass of hard liquor (44 mL). General instructions  Take over-the-counter and prescription medicines only as told by your health care provider.  Keep all follow-up visits. This is important. This includes having regularly scheduled colonoscopies. Talk to your health care provider about when you need a  colonoscopy. Contact a health care provider if:  You have new or worsening bleeding during a bowel movement.  You have new or increased blood in your stool.  You have a change in bowel habits.  You lose weight for no known reason. Summary  Colon polyps are tissue growths inside the colon, which is part of the large intestine. They are one type of polyp that can grow in the body.  Most colon polyps are noncancerous (benign), but some can become cancerous over time.  This condition is diagnosed with a colonoscopy.  This condition is treated by removing any polyps that are found. Most polyps can be removed during a colonoscopy. This information is not intended to replace advice given to you by your health care provider. Make sure you discuss any questions you have with your health care provider. Document Revised: 04/21/2019 Document Reviewed: 04/21/2019 Elsevier Patient Education  [place].  Full Liquid Diet A full liquid diet refers to fluids and foods that are liquid, or will become liquid, at room temperature. This diet should only be used for a short period of time to help you recover from illness or surgery. Your health care provider or dietitian will help determine when it is safe to eat regular foods again. What are tips for following this plan? Reading food labels  Check food labels of nutrition shakes for the amount of protein. Look for nutrition shakes that have at least 8-10 grams of protein in each serving.  Choose drinks, such as milks and juices, that are "fortified" or "enriched." This means that vitamins and minerals have been added. Shopping  Buy pre-made nutrition shakes to keep on hand.  To vary your choices, buy different flavors of milks and shakes. Meal planning  Choose flavors and foods that you enjoy.  To make sure you get enough energy and calories from food: ? Have three full liquid meals each day. Have a liquid snack between each  meal. ? Drink 6-8 oz (177-237 mL) of a nutritional supplement shake with meals or as snacks. ? Add protein powder, powdered  milk, milk, or yogurt to shakes to increase the amount of protein.  Drink at least one serving a day of citrus fruit juice or fruit juice that has vitamin C added. General guidelines  Before starting the full liquid diet, check with your health care provider to know what foods you should avoid. These may include full-fat or high-fiber liquids.  You may have any liquid or food that becomes a liquid at room temperature. The food is considered a liquid if it can be poured off a spoon at room temperature.  Do not drink alcohol unless approved by your health care provider.  This diet gives you most of the nutrients that you need for energy, but you may not get enough of certain vitamins, minerals, and fiber. Make sure to talk to your health care provider or dietitian about: ? How many calories you need to eat each day. ? How much fluid you should have each day. ? Taking a multivitamin or a nutritional supplement. What foods should I eat? Fruits Fruit juice without pulp. Strained fruit pures (seeds and skins removed). Vegetables Pulp-free tomato or vegetable juice. Vegetables pured in soup. Grains Thin, hot cereal, such as farina. Soft-cooked pasta or rice pured in soup. Meats and other proteins Beef, chicken, and fish broths. Powdered protein supplements. Dairy Milk and milk-based beverages, including milk shakes and instant breakfast mixes. Smooth yogurt. Pured cottage cheese. Fats and oils Melted margarine and butter. Cream. Canola, almond, avocado, corn, grapeseed, sunflower, and sesame oils. Gravy. Beverages Water. Coffee and tea (caffeinated or decaffeinated). Cocoa. Liquid nutritional supplements. Soft drinks. Nondairy milks, such as almond, coconut, rice, or soy milk. Sweets and desserts Custard. Pudding. Flavored gelatin. Smooth ice cream (without nuts or  candy pieces). Sherbet. Frozen ice pops. [place] ice. Pudding pops. Seasonings and condiments Salt and pepper. Spices. Vinegar. Ketchup. Yellow mustard. Smooth sauces, such as [place], cheese sauce, or white sauce. Soy sauce. Syrup. Honey. Jelly (without fruit pieces). Other foods Cocoa powder. Cream soups. Strained soups. The items listed above may not be a complete list of foods and beverages you can eat. Contact a dietitian for more information.   What foods should I avoid? Fruits All whole fresh, frozen, or canned fruits. Vegetables All whole fresh, frozen, or canned vegetables. Grains Whole grains. Pasta. Rice. Cold cereal. Bread. Crackers. Meats and other proteins All cuts of meat, poultry, and fish. Eggs. Tofu and soy protein. Nuts and nut butters. Precooked or cured meat, such as sausages or meat loaves. Dairy Hard cheese. Yogurt with fruit chunks. Fats and oils Coconut oil. Palm oil. Lard. Cold butter. Sweets and desserts Ice cream or other frozen desserts that contain solids, such as nuts, chocolate chips, and pieces of cookies. Cakes. Cookies. Candy. Seasonings and condiments Stone-ground mustard. Other foods Soups with chunks or pieces. The items listed above may not be a complete list of foods and beverages you should avoid. Contact a dietitian for more information. Summary  A full liquid diet refers to fluids and foods that are liquid or will become liquid at room temperature.  This diet should only be used for a short period of time to help you recover from illness or surgery. Ask your health care provider or dietitian when it is safe for you to eat regular foods.  To make sure you get enough calories and nutrients, eat three meals each day with snacks in between. Drink pre-made nutritional supplement shakes or add protein powder to homemade shakes. Talk to your health care provider  about taking a vitamin and mineral supplement. This information is not intended to  replace advice given to you by your health care provider. Make sure you discuss any questions you have with your health care provider. Document Revised: 10/19/2019 Document Reviewed: 10/19/2019 Elsevier Patient Education  2021 [employer].

## 2020-05-23 NOTE — Transfer of Care (Signed)
Immediate Anesthesia Transfer of Care Note  Patient: Keimani Laufer  Procedure(s) Performed: COLONOSCOPY WITH PROPOFOL (N/A ) ESOPHAGOGASTRODUODENOSCOPY (EGD) WITH PROPOFOL (N/A ) ESOPHAGEAL DILATION (N/A ) BIOPSY POLYPECTOMY  Patient Location: PACU  Anesthesia Type:General  Level of Consciousness: awake and alert   Airway & Oxygen Therapy: Patient Spontanous Breathing  Post-op Assessment: Report given to RN and Post -op Vital signs reviewed and stable  Post vital signs: Reviewed and stable  Last Vitals:  Vitals Value Taken Time  BP    Temp    Pulse    Resp    SpO2      Last Pain:  Vitals:   05/23/20 1105  TempSrc:   PainSc: 0-No pain      Patients Stated Pain Goal: 7 (13/08/65 7846)  Complications: No complications documented.

## 2020-05-23 NOTE — Interval H&P Note (Signed)
History and Physical Interval Note:  05/23/2020 10:53 AM Angela Mullins is a 65 y.o. female with PMH anxiety, HTN, HLD, GERD, who presents for evaluation of dysphagia, throat discomfort, regurgitation and CRC screening.  Patient reoprts she is still present recurrent discomfort in her lowe throat area alongn with episodic dysphagia to pills and solids. Has presented regurgitation despite using PPI, but no heartburn. The patient had a MBS that did not show any oropharyngeal alterations but there was transietn tretention of barium tablet in lower esophageal area.  BP 137/79   Pulse 93   Temp 98 F (36.7 C) (Oral)   Resp (!) 21   Ht 5' 3.75" (1.619 m)   Wt 58.1 kg   SpO2 98%   BMI 22.14 kg/m  GENERAL: The patient is AO x3, in no acute distress. HEENT: Head is normocephalic and atraumatic. EOMI are intact. Mouth is well hydrated and without lesions. NECK: Supple. No masses LUNGS: Clear to auscultation. No presence of rhonchi/wheezing/rales. Adequate chest expansion HEART: RRR, normal s1 and s2. ABDOMEN: Soft, nontender, no guarding, no peritoneal signs, and nondistended. BS +. No masses. EXTREMITIES: Without any cyanosis, clubbing, rash, lesions or edema. NEUROLOGIC: AOx3, no focal motor deficit. SKIN: no jaundice, no rashes   Angela Mullins  has presented today for surgery, with the diagnosis of Screening colonoscopy Dysphagia.  The various methods of treatment have been discussed with the patient and family. After consideration of risks, benefits and other options for treatment, the patient has consented to  Procedure(s) with comments: COLONOSCOPY WITH PROPOFOL (N/A) - 10:45 AM ESOPHAGOGASTRODUODENOSCOPY (EGD) WITH PROPOFOL (N/A) ESOPHAGEAL DILATION (N/A) as a surgical intervention.  The patient's history has been reviewed, patient examined, no change in status, stable for surgery.  I have reviewed the patient's chart and labs.  Questions were answered to the patient's satisfaction.      Maylon Peppers Mayorga

## 2020-05-23 NOTE — Anesthesia Preprocedure Evaluation (Addendum)
Anesthesia Evaluation  Patient identified by MRN, date of birth, ID band Patient awake    Reviewed: Allergy & Precautions, NPO status , Patient's Chart, lab work & pertinent test results  History of Anesthesia Complications Negative for: history of anesthetic complications  Airway Mallampati: II  TM Distance: >3 FB Neck ROM: Full    Dental  (+) Dental Advisory Given, Caps Crowns :   Pulmonary former smoker,    Pulmonary exam normal breath sounds clear to auscultation       Cardiovascular Exercise Tolerance: Good hypertension, Pt. on medications Normal cardiovascular exam Rhythm:Regular Rate:Normal     Neuro/Psych Anxiety negative neurological ROS     GI/Hepatic Neg liver ROS, GERD  Medicated and Controlled,  Endo/Other  negative endocrine ROS  Renal/GU negative Renal ROS     Musculoskeletal negative musculoskeletal ROS (+)   Abdominal   Peds  Hematology negative hematology ROS (+)   Anesthesia Other Findings   Reproductive/Obstetrics negative OB ROS                            Anesthesia Physical Anesthesia Plan  ASA: II  Anesthesia Plan: General   Post-op Pain Management:    Induction: Intravenous  PONV Risk Score and Plan: Propofol infusion  Airway Management Planned: Nasal Cannula and Natural Airway  Additional Equipment:   Intra-op Plan:   Post-operative Plan:   Informed Consent: I have reviewed the patients History and Physical, chart, labs and discussed the procedure including the risks, benefits and alternatives for the proposed anesthesia with the patient or authorized representative who has indicated his/her understanding and acceptance.     Dental advisory given  Plan Discussed with: CRNA and Surgeon  Anesthesia Plan Comments:        Anesthesia Quick Evaluation

## 2020-05-24 ENCOUNTER — Encounter (INDEPENDENT_AMBULATORY_CARE_PROVIDER_SITE_OTHER): Payer: Self-pay | Admitting: *Deleted

## 2020-05-24 LAB — SURGICAL PATHOLOGY

## 2020-05-24 NOTE — Addendum Note (Signed)
Addendum  created 05/24/20 0711 by Jonna Munro, CRNA   Charge Capture section accepted

## 2020-05-27 ENCOUNTER — Inpatient Hospital Stay (HOSPITAL_COMMUNITY)
Admission: EM | Admit: 2020-05-27 | Discharge: 2020-06-03 | DRG: 097 | Disposition: A | Discharge status: Home / Self Care | Attending: Internal Medicine | Admitting: Internal Medicine

## 2020-05-27 ENCOUNTER — Encounter (HOSPITAL_COMMUNITY): Payer: Self-pay

## 2020-05-27 ENCOUNTER — Other Ambulatory Visit: Payer: Self-pay

## 2020-05-27 ENCOUNTER — Emergency Department (HOSPITAL_COMMUNITY)

## 2020-05-27 DIAGNOSIS — Z888 Allergy status to other drugs, medicaments and biological substances status: Secondary | ICD-10-CM

## 2020-05-27 DIAGNOSIS — J441 Chronic obstructive pulmonary disease with (acute) exacerbation: Secondary | ICD-10-CM

## 2020-05-27 DIAGNOSIS — Z885 Allergy status to narcotic agent status: Secondary | ICD-10-CM

## 2020-05-27 DIAGNOSIS — E860 Dehydration: Secondary | ICD-10-CM | POA: Diagnosis present

## 2020-05-27 DIAGNOSIS — I6522 Occlusion and stenosis of left carotid artery: Secondary | ICD-10-CM | POA: Diagnosis not present

## 2020-05-27 DIAGNOSIS — R519 Headache, unspecified: Secondary | ICD-10-CM | POA: Diagnosis present

## 2020-05-27 DIAGNOSIS — E559 Vitamin D deficiency, unspecified: Secondary | ICD-10-CM | POA: Diagnosis not present

## 2020-05-27 DIAGNOSIS — J439 Emphysema, unspecified: Secondary | ICD-10-CM | POA: Diagnosis not present

## 2020-05-27 DIAGNOSIS — Z8249 Family history of ischemic heart disease and other diseases of the circulatory system: Secondary | ICD-10-CM | POA: Diagnosis not present

## 2020-05-27 DIAGNOSIS — Z20822 Contact with and (suspected) exposure to covid-19: Secondary | ICD-10-CM | POA: Diagnosis present

## 2020-05-27 DIAGNOSIS — Z9049 Acquired absence of other specified parts of digestive tract: Secondary | ICD-10-CM

## 2020-05-27 DIAGNOSIS — K5730 Diverticulosis of large intestine without perforation or abscess without bleeding: Secondary | ICD-10-CM | POA: Diagnosis not present

## 2020-05-27 DIAGNOSIS — R739 Hyperglycemia, unspecified: Secondary | ICD-10-CM | POA: Diagnosis present

## 2020-05-27 DIAGNOSIS — E785 Hyperlipidemia, unspecified: Secondary | ICD-10-CM | POA: Diagnosis not present

## 2020-05-27 DIAGNOSIS — E876 Hypokalemia: Secondary | ICD-10-CM | POA: Diagnosis present

## 2020-05-27 DIAGNOSIS — Z7982 Long term (current) use of aspirin: Secondary | ICD-10-CM | POA: Diagnosis not present

## 2020-05-27 DIAGNOSIS — J9601 Acute respiratory failure with hypoxia: Secondary | ICD-10-CM

## 2020-05-27 DIAGNOSIS — R1310 Dysphagia, unspecified: Secondary | ICD-10-CM | POA: Diagnosis not present

## 2020-05-27 DIAGNOSIS — R001 Bradycardia, unspecified: Secondary | ICD-10-CM

## 2020-05-27 DIAGNOSIS — R6510 Systemic inflammatory response syndrome (SIRS) of non-infectious origin without acute organ dysfunction: Secondary | ICD-10-CM | POA: Diagnosis present

## 2020-05-27 DIAGNOSIS — I10 Essential (primary) hypertension: Secondary | ICD-10-CM | POA: Diagnosis not present

## 2020-05-27 DIAGNOSIS — I700 Atherosclerosis of aorta: Secondary | ICD-10-CM | POA: Diagnosis not present

## 2020-05-27 DIAGNOSIS — W57XXXA Bitten or stung by nonvenomous insect and other nonvenomous arthropods, initial encounter: Secondary | ICD-10-CM | POA: Diagnosis not present

## 2020-05-27 DIAGNOSIS — Z79899 Other long term (current) drug therapy: Secondary | ICD-10-CM | POA: Diagnosis not present

## 2020-05-27 DIAGNOSIS — G43909 Migraine, unspecified, not intractable, without status migrainosus: Secondary | ICD-10-CM | POA: Diagnosis present

## 2020-05-27 DIAGNOSIS — F419 Anxiety disorder, unspecified: Secondary | ICD-10-CM | POA: Diagnosis present

## 2020-05-27 DIAGNOSIS — Z87891 Personal history of nicotine dependence: Secondary | ICD-10-CM

## 2020-05-27 DIAGNOSIS — E7800 Pure hypercholesterolemia, unspecified: Secondary | ICD-10-CM | POA: Diagnosis not present

## 2020-05-27 DIAGNOSIS — G4489 Other headache syndrome: Secondary | ICD-10-CM | POA: Diagnosis present

## 2020-05-27 DIAGNOSIS — G030 Nonpyogenic meningitis: Principal | ICD-10-CM

## 2020-05-27 DIAGNOSIS — K219 Gastro-esophageal reflux disease without esophagitis: Secondary | ICD-10-CM | POA: Diagnosis not present

## 2020-05-27 DIAGNOSIS — J342 Deviated nasal septum: Secondary | ICD-10-CM | POA: Diagnosis present

## 2020-05-27 DIAGNOSIS — Z833 Family history of diabetes mellitus: Secondary | ICD-10-CM | POA: Diagnosis not present

## 2020-05-27 DIAGNOSIS — A419 Sepsis, unspecified organism: Secondary | ICD-10-CM

## 2020-05-27 DIAGNOSIS — Z820 Family history of epilepsy and other diseases of the nervous system: Secondary | ICD-10-CM | POA: Diagnosis not present

## 2020-05-27 LAB — COMPREHENSIVE METABOLIC PANEL
ALT: 49 U/L — ABNORMAL HIGH (ref 0–44)
AST: 28 U/L (ref 15–41)
Albumin: 4.2 g/dL (ref 3.5–5.0)
Alkaline Phosphatase: 112 U/L (ref 38–126)
Anion gap: 12 (ref 5–15)
BUN: 16 mg/dL (ref 8–23)
CO2: 22 mmol/L (ref 22–32)
Calcium: 10.1 mg/dL (ref 8.9–10.3)
Chloride: 106 mmol/L (ref 98–111)
Creatinine, Ser: 0.88 mg/dL (ref 0.44–1.00)
GFR, Estimated: >60 mL/min (ref >60)
Glucose, Bld: 148 mg/dL — ABNORMAL HIGH (ref 70–99)
Potassium: 3.7 mmol/L (ref 3.5–5.1)
Sodium: 140 mmol/L (ref 135–145)
Total Bilirubin: 0.9 mg/dL (ref 0.3–1.2)
Total Protein: 8.3 g/dL — ABNORMAL HIGH (ref 6.5–8.1)

## 2020-05-27 LAB — DIFFERENTIAL
Abs Immature Granulocytes: 0.16 10*3/uL — ABNORMAL HIGH (ref 0.00–0.07)
Basophils Absolute: 0 10*3/uL (ref 0.0–0.1)
Basophils Relative: 0 %
Eosinophils Absolute: 0 10*3/uL (ref 0.0–0.5)
Eosinophils Relative: 0 %
Immature Granulocytes: 1 %
Lymphocytes Relative: 4 %
Lymphs Abs: 0.8 10*3/uL (ref 0.7–4.0)
Monocytes Absolute: 0.9 10*3/uL (ref 0.1–1.0)
Monocytes Relative: 5 %
Neutro Abs: 17.4 10*3/uL — ABNORMAL HIGH (ref 1.7–7.7)
Neutrophils Relative %: 90 %

## 2020-05-27 LAB — CBG MONITORING, ED: Glucose-Capillary: 136 mg/dL — ABNORMAL HIGH (ref 70–99)

## 2020-05-27 LAB — I-STAT CHEM 8, ED
BUN: 15 mg/dL (ref 8–23)
Calcium, Ion: 1.34 mmol/L (ref 1.15–1.40)
Chloride: 106 mmol/L (ref 98–111)
Creatinine, Ser: 0.8 mg/dL (ref 0.44–1.00)
Glucose, Bld: 148 mg/dL — ABNORMAL HIGH (ref 70–99)
HCT: 46 % (ref 36.0–46.0)
Hemoglobin: 15.6 g/dL — ABNORMAL HIGH (ref 12.0–15.0)
Potassium: 3.8 mmol/L (ref 3.5–5.1)
Sodium: 143 mmol/L (ref 135–145)
TCO2: 21 mmol/L — ABNORMAL LOW (ref 22–32)

## 2020-05-27 LAB — PROTIME-INR
INR: 1.1 (ref 0.8–1.2)
Prothrombin Time: 14 seconds (ref 11.4–15.2)

## 2020-05-27 LAB — CBC
HCT: 45.7 % (ref 36.0–46.0)
Hemoglobin: 15.1 g/dL — ABNORMAL HIGH (ref 12.0–15.0)
MCH: 29.6 pg (ref 26.0–34.0)
MCHC: 33 g/dL (ref 30.0–36.0)
MCV: 89.6 fL (ref 80.0–100.0)
Platelets: 273 10*3/uL (ref 150–400)
RBC: 5.1 MIL/uL (ref 3.87–5.11)
RDW: 12.7 % (ref 11.5–15.5)
WBC: 19.3 10*3/uL — ABNORMAL HIGH (ref 4.0–10.5)
nRBC: 0 % (ref 0.0–0.2)

## 2020-05-27 LAB — RESP PANEL BY RT-PCR (FLU A&B, COVID) ARPGX2
Influenza A by PCR: NEGATIVE
Influenza B by PCR: NEGATIVE
SARS Coronavirus 2 by RT PCR: NEGATIVE

## 2020-05-27 LAB — LIPASE, BLOOD: Lipase: 19 U/L (ref 11–51)

## 2020-05-27 LAB — LACTIC ACID, PLASMA
Lactic Acid, Venous: 1.3 mmol/L (ref 0.5–1.9)
Lactic Acid, Venous: 0.8 mmol/L (ref 0.5–1.9)

## 2020-05-27 LAB — APTT: aPTT: 31 seconds (ref 24–36)

## 2020-05-27 MED ORDER — SODIUM CHLORIDE 0.9 % IV SOLN
2.0000 g | Freq: Once | INTRAVENOUS | Status: AC
  Administered 2020-05-27: 2 g via INTRAVENOUS
  Filled 2020-05-27: qty 2

## 2020-05-27 MED ORDER — VANCOMYCIN HCL IN DEXTROSE 1-5 GM/200ML-% IV SOLN
1000.0000 mg | Freq: Once | INTRAVENOUS | Status: AC
Start: 2020-05-27 — End: 2020-05-27
  Administered 2020-05-27: 1000 mg via INTRAVENOUS
  Filled 2020-05-27: qty 200

## 2020-05-27 MED ORDER — SODIUM CHLORIDE 0.9 % IV SOLN
2.0000 g | Freq: Three times a day (TID) | INTRAVENOUS | Status: DC
  Administered 2020-05-28 – 2020-05-29 (×4): 2 g via INTRAVENOUS
  Filled 2020-05-27 (×4): qty 2

## 2020-05-27 MED ORDER — METRONIDAZOLE 500 MG/100ML IV SOLN
500.0000 mg | Freq: Once | INTRAVENOUS | Status: AC
  Administered 2020-05-27: 500 mg via INTRAVENOUS
  Filled 2020-05-27: qty 100

## 2020-05-27 MED ORDER — LACTATED RINGERS IV BOLUS (SEPSIS)
1000.0000 mL | Freq: Once | INTRAVENOUS | Status: AC
  Administered 2020-05-27: 1000 mL via INTRAVENOUS

## 2020-05-27 MED ORDER — VANCOMYCIN HCL 750 MG/150ML IV SOLN
750.0000 mg | Freq: Two times a day (BID) | INTRAVENOUS | Status: DC
  Administered 2020-05-28: 750 mg via INTRAVENOUS
  Filled 2020-05-27 (×4): qty 150

## 2020-05-27 MED ORDER — HYDROMORPHONE HCL 1 MG/ML IJ SOLN
1.0000 mg | Freq: Once | INTRAMUSCULAR | Status: AC
  Administered 2020-05-27: 1 mg via INTRAVENOUS
  Filled 2020-05-27: qty 1

## 2020-05-27 MED ORDER — LACTATED RINGERS IV SOLN: INTRAVENOUS | Status: AC

## 2020-05-27 MED ORDER — ONDANSETRON HCL 4 MG/2ML IJ SOLN
4.0000 mg | Freq: Once | INTRAMUSCULAR | Status: AC
  Administered 2020-05-27: 4 mg via INTRAVENOUS
  Filled 2020-05-27: qty 2

## 2020-05-27 NOTE — Progress Notes (Signed)
Pharmacy Antibiotic Note  Angela Mullins is a 65 y.o. female admitted on 05/27/2020 with sepsis.  Pharmacy has been consulted for cefepime and vancomycin dosing.  Plan: Vancomycin 750mg  IV every 12 hours.  Goal trough 15-20 mcg/mL. cefepime 2gm iv q8h  Height: 5' 3.75" (161.9 cm) Weight: 58.1 kg (128 lb) IBW/kg (Calculated) : 54.13  Temp (24hrs), Avg:98.5 F (36.9 C), Min:98.5 F (36.9 C), Max:98.5 F (36.9 C)  Recent Labs  Lab 05/27/20 1921 05/27/20 1933  WBC 19.3*  --   CREATININE 0.88 0.80    Estimated Creatinine Clearance: 61.5 mL/min (by C-G formula based on SCr of 0.8 mg/dL).    Allergies  Allergen Reactions  . Demerol [Meperidine Hcl] Other (See Comments)    Does not help  . Quinine Derivatives Nausea And Vomiting    Antimicrobials this admission: 5/14 cefepime >>  5/14 vancomycin >>  5/14 metronidazole x 1   Microbiology results: 5/14 BCx: sent  5/14 UCx: sent  5/14 Resp Panel: sent   Thank you for allowing pharmacy to be a part of this patient's care.  Angela Mullins 05/27/2020 7:58 PM

## 2020-05-27 NOTE — Progress Notes (Signed)
Elink following for Sepsis Protocol 

## 2020-05-27 NOTE — ED Triage Notes (Signed)
Pt arrived via POV with spouse who reports Pt has had a severe headache for 48hrs following a colonoscopy earlier in the week. Pts spouse reports calling PCP and was advised if symptoms did not resolve to have Pt brought to ED for evaluation. Pt does has some difficulty with speech during Triage.

## 2020-05-28 ENCOUNTER — Observation Stay (HOSPITAL_COMMUNITY)

## 2020-05-28 ENCOUNTER — Encounter (HOSPITAL_COMMUNITY): Payer: Self-pay | Admitting: Internal Medicine

## 2020-05-28 DIAGNOSIS — Z7982 Long term (current) use of aspirin: Secondary | ICD-10-CM | POA: Diagnosis not present

## 2020-05-28 DIAGNOSIS — Z885 Allergy status to narcotic agent status: Secondary | ICD-10-CM | POA: Diagnosis not present

## 2020-05-28 DIAGNOSIS — K219 Gastro-esophageal reflux disease without esophagitis: Secondary | ICD-10-CM | POA: Diagnosis present

## 2020-05-28 DIAGNOSIS — I6522 Occlusion and stenosis of left carotid artery: Secondary | ICD-10-CM | POA: Diagnosis present

## 2020-05-28 DIAGNOSIS — Z87891 Personal history of nicotine dependence: Secondary | ICD-10-CM | POA: Diagnosis not present

## 2020-05-28 DIAGNOSIS — E7800 Pure hypercholesterolemia, unspecified: Secondary | ICD-10-CM | POA: Diagnosis present

## 2020-05-28 DIAGNOSIS — G43909 Migraine, unspecified, not intractable, without status migrainosus: Secondary | ICD-10-CM | POA: Diagnosis present

## 2020-05-28 DIAGNOSIS — I10 Essential (primary) hypertension: Secondary | ICD-10-CM | POA: Diagnosis present

## 2020-05-28 DIAGNOSIS — J439 Emphysema, unspecified: Secondary | ICD-10-CM | POA: Diagnosis present

## 2020-05-28 DIAGNOSIS — Z9049 Acquired absence of other specified parts of digestive tract: Secondary | ICD-10-CM | POA: Diagnosis not present

## 2020-05-28 DIAGNOSIS — Z8249 Family history of ischemic heart disease and other diseases of the circulatory system: Secondary | ICD-10-CM | POA: Diagnosis not present

## 2020-05-28 DIAGNOSIS — E785 Hyperlipidemia, unspecified: Secondary | ICD-10-CM | POA: Diagnosis present

## 2020-05-28 DIAGNOSIS — G44209 Tension-type headache, unspecified, not intractable: Secondary | ICD-10-CM | POA: Diagnosis not present

## 2020-05-28 DIAGNOSIS — F419 Anxiety disorder, unspecified: Secondary | ICD-10-CM | POA: Insufficient documentation

## 2020-05-28 DIAGNOSIS — Z20822 Contact with and (suspected) exposure to covid-19: Secondary | ICD-10-CM | POA: Diagnosis present

## 2020-05-28 DIAGNOSIS — J441 Chronic obstructive pulmonary disease with (acute) exacerbation: Secondary | ICD-10-CM | POA: Diagnosis not present

## 2020-05-28 DIAGNOSIS — Z79899 Other long term (current) drug therapy: Secondary | ICD-10-CM | POA: Diagnosis not present

## 2020-05-28 DIAGNOSIS — R6510 Systemic inflammatory response syndrome (SIRS) of non-infectious origin without acute organ dysfunction: Secondary | ICD-10-CM | POA: Diagnosis not present

## 2020-05-28 DIAGNOSIS — R001 Bradycardia, unspecified: Secondary | ICD-10-CM

## 2020-05-28 DIAGNOSIS — J9601 Acute respiratory failure with hypoxia: Secondary | ICD-10-CM | POA: Diagnosis not present

## 2020-05-28 DIAGNOSIS — Z888 Allergy status to other drugs, medicaments and biological substances status: Secondary | ICD-10-CM | POA: Diagnosis not present

## 2020-05-28 DIAGNOSIS — R519 Headache, unspecified: Secondary | ICD-10-CM | POA: Diagnosis present

## 2020-05-28 DIAGNOSIS — I700 Atherosclerosis of aorta: Secondary | ICD-10-CM | POA: Diagnosis present

## 2020-05-28 DIAGNOSIS — R739 Hyperglycemia, unspecified: Secondary | ICD-10-CM | POA: Diagnosis present

## 2020-05-28 DIAGNOSIS — E559 Vitamin D deficiency, unspecified: Secondary | ICD-10-CM | POA: Diagnosis present

## 2020-05-28 DIAGNOSIS — G030 Nonpyogenic meningitis: Secondary | ICD-10-CM | POA: Diagnosis present

## 2020-05-28 DIAGNOSIS — Z833 Family history of diabetes mellitus: Secondary | ICD-10-CM | POA: Diagnosis not present

## 2020-05-28 DIAGNOSIS — Z820 Family history of epilepsy and other diseases of the nervous system: Secondary | ICD-10-CM | POA: Diagnosis not present

## 2020-05-28 DIAGNOSIS — K5730 Diverticulosis of large intestine without perforation or abscess without bleeding: Secondary | ICD-10-CM | POA: Diagnosis present

## 2020-05-28 DIAGNOSIS — W57XXXA Bitten or stung by nonvenomous insect and other nonvenomous arthropods, initial encounter: Secondary | ICD-10-CM | POA: Diagnosis present

## 2020-05-28 HISTORY — DX: Emphysema, unspecified: J43.9

## 2020-05-28 HISTORY — DX: Atherosclerosis of aorta: I70.0

## 2020-05-28 LAB — URINALYSIS, ROUTINE W REFLEX MICROSCOPIC
Bilirubin Urine: NEGATIVE
Glucose, UA: 50 mg/dL — AB
Hgb urine dipstick: NEGATIVE
Ketones, ur: 5 mg/dL — AB
Leukocytes,Ua: NEGATIVE
Nitrite: NEGATIVE
Protein, ur: NEGATIVE mg/dL
Specific Gravity, Urine: 1.025 (ref 1.005–1.030)
pH: 5 (ref 5.0–8.0)

## 2020-05-28 LAB — CBC WITH DIFFERENTIAL/PLATELET
Abs Immature Granulocytes: 0.11 10*3/uL — ABNORMAL HIGH (ref 0.00–0.07)
Basophils Absolute: 0 10*3/uL (ref 0.0–0.1)
Basophils Relative: 0 %
Eosinophils Absolute: 0 10*3/uL (ref 0.0–0.5)
Eosinophils Relative: 0 %
HCT: 37.2 % (ref 36.0–46.0)
Hemoglobin: 11.9 g/dL — ABNORMAL LOW (ref 12.0–15.0)
Immature Granulocytes: 1 %
Lymphocytes Relative: 7 %
Lymphs Abs: 1 10*3/uL (ref 0.7–4.0)
MCH: 29.5 pg (ref 26.0–34.0)
MCHC: 32 g/dL (ref 30.0–36.0)
MCV: 92.1 fL (ref 80.0–100.0)
Monocytes Absolute: 1.1 10*3/uL — ABNORMAL HIGH (ref 0.1–1.0)
Monocytes Relative: 8 %
Neutro Abs: 12.6 10*3/uL — ABNORMAL HIGH (ref 1.7–7.7)
Neutrophils Relative %: 84 %
Platelets: 228 10*3/uL (ref 150–400)
RBC: 4.04 MIL/uL (ref 3.87–5.11)
RDW: 13.1 % (ref 11.5–15.5)
WBC: 14.8 10*3/uL — ABNORMAL HIGH (ref 4.0–10.5)
nRBC: 0 % (ref 0.0–0.2)

## 2020-05-28 LAB — COMPREHENSIVE METABOLIC PANEL
ALT: 66 U/L — ABNORMAL HIGH (ref 0–44)
AST: 45 U/L — ABNORMAL HIGH (ref 15–41)
Albumin: 2.8 g/dL — ABNORMAL LOW (ref 3.5–5.0)
Alkaline Phosphatase: 151 U/L — ABNORMAL HIGH (ref 38–126)
Anion gap: 8 (ref 5–15)
BUN: 12 mg/dL (ref 8–23)
CO2: 26 mmol/L (ref 22–32)
Calcium: 8.8 mg/dL — ABNORMAL LOW (ref 8.9–10.3)
Chloride: 105 mmol/L (ref 98–111)
Creatinine, Ser: 0.66 mg/dL (ref 0.44–1.00)
GFR, Estimated: >60 mL/min (ref >60)
Glucose, Bld: 127 mg/dL — ABNORMAL HIGH (ref 70–99)
Potassium: 3.8 mmol/L (ref 3.5–5.1)
Sodium: 139 mmol/L (ref 135–145)
Total Bilirubin: 0.7 mg/dL (ref 0.3–1.2)
Total Protein: 5.8 g/dL — ABNORMAL LOW (ref 6.5–8.1)

## 2020-05-28 LAB — MRSA PCR SCREENING: MRSA by PCR: NEGATIVE

## 2020-05-28 LAB — PROCALCITONIN: Procalcitonin: 2.71 ng/mL

## 2020-05-28 MED ORDER — ONDANSETRON HCL 4 MG PO TABS: 4.0000 mg | ORAL_TABLET | Freq: Four times a day (QID) | ORAL | Status: DC | PRN

## 2020-05-28 MED ORDER — DIPHENHYDRAMINE HCL 25 MG PO CAPS: 25.0000 mg | ORAL_CAPSULE | Freq: Three times a day (TID) | ORAL | Status: DC | PRN

## 2020-05-28 MED ORDER — METRONIDAZOLE 500 MG/100ML IV SOLN
500.0000 mg | Freq: Three times a day (TID) | INTRAVENOUS | Status: DC
  Administered 2020-05-28 – 2020-05-29 (×4): 500 mg via INTRAVENOUS
  Filled 2020-05-28 (×4): qty 100

## 2020-05-28 MED ORDER — ACETAMINOPHEN 325 MG PO TABS
650.0000 mg | ORAL_TABLET | Freq: Four times a day (QID) | ORAL | Status: DC | PRN
  Administered 2020-05-29 (×2): 650 mg via ORAL
  Filled 2020-05-28 (×2): qty 2

## 2020-05-28 MED ORDER — LISINOPRIL 10 MG PO TABS
10.0000 mg | ORAL_TABLET | Freq: Every day | ORAL | Status: DC
  Administered 2020-05-29 – 2020-06-03 (×6): 10 mg via ORAL
  Filled 2020-05-28 (×7): qty 1

## 2020-05-28 MED ORDER — HYDROMORPHONE HCL 1 MG/ML IJ SOLN: 1.0000 mg | Freq: Once | INTRAMUSCULAR | Status: DC | PRN

## 2020-05-28 MED ORDER — IOHEXOL 350 MG/ML SOLN
75.0000 mL | Freq: Once | INTRAVENOUS | Status: AC | PRN
  Administered 2020-05-28: 75 mL via INTRAVENOUS

## 2020-05-28 MED ORDER — CLONAZEPAM 0.5 MG PO TABS
0.5000 mg | ORAL_TABLET | Freq: Two times a day (BID) | ORAL | Status: DC | PRN
  Administered 2020-06-03: 0.5 mg via ORAL
  Filled 2020-05-28 (×2): qty 1

## 2020-05-28 MED ORDER — LORATADINE 10 MG PO TABS
10.0000 mg | ORAL_TABLET | Freq: Every day | ORAL | Status: DC
  Administered 2020-05-29 – 2020-06-03 (×6): 10 mg via ORAL
  Filled 2020-05-28 (×7): qty 1

## 2020-05-28 MED ORDER — ACETAMINOPHEN 650 MG RE SUPP
650.0000 mg | Freq: Four times a day (QID) | RECTAL | Status: DC | PRN
  Administered 2020-05-28: 650 mg via RECTAL
  Filled 2020-05-28: qty 1

## 2020-05-28 MED ORDER — ONDANSETRON HCL 4 MG/2ML IJ SOLN: 4.0000 mg | Freq: Four times a day (QID) | INTRAMUSCULAR | Status: DC | PRN

## 2020-05-28 MED ORDER — HYDROMORPHONE HCL 1 MG/ML IJ SOLN
INTRAMUSCULAR | Status: AC
  Administered 2020-05-28: 1 mg via INTRAVENOUS
  Filled 2020-05-28: qty 1

## 2020-05-28 MED ORDER — FENOFIBRATE 160 MG PO TABS
160.0000 mg | ORAL_TABLET | Freq: Every day | ORAL | Status: DC
  Administered 2020-05-29 – 2020-06-03 (×6): 160 mg via ORAL
  Filled 2020-05-28 (×7): qty 1

## 2020-05-28 MED ORDER — KETOROLAC TROMETHAMINE 30 MG/ML IJ SOLN
30.0000 mg | Freq: Once | INTRAMUSCULAR | Status: AC | PRN
  Administered 2020-05-28: 30 mg via INTRAVENOUS
  Filled 2020-05-28: qty 1

## 2020-05-28 MED ORDER — KETOROLAC TROMETHAMINE 15 MG/ML IJ SOLN
15.0000 mg | Freq: Four times a day (QID) | INTRAMUSCULAR | Status: AC | PRN
  Administered 2020-05-28 – 2020-05-29 (×4): 15 mg via INTRAVENOUS
  Filled 2020-05-28 (×4): qty 1

## 2020-05-28 MED ORDER — ELETRIPTAN HYDROBROMIDE 40 MG PO TABS
40.0000 mg | ORAL_TABLET | ORAL | Status: DC | PRN
  Filled 2020-05-28: qty 1

## 2020-05-28 MED ORDER — ASPIRIN EC 81 MG PO TBEC
81.0000 mg | DELAYED_RELEASE_TABLET | Freq: Every day | ORAL | Status: DC
  Administered 2020-05-29 – 2020-06-03 (×6): 81 mg via ORAL
  Filled 2020-05-28 (×7): qty 1

## 2020-05-28 MED ORDER — HYDROMORPHONE HCL 1 MG/ML IJ SOLN: 1.0000 mg | Freq: Once | INTRAMUSCULAR | Status: AC

## 2020-05-28 MED ORDER — LACTATED RINGERS IV SOLN: INTRAVENOUS | Status: DC

## 2020-05-28 MED ORDER — PANTOPRAZOLE SODIUM 40 MG PO TBEC
40.0000 mg | DELAYED_RELEASE_TABLET | Freq: Every day | ORAL | Status: DC
  Administered 2020-05-29 – 2020-06-03 (×6): 40 mg via ORAL
  Filled 2020-05-28 (×7): qty 1

## 2020-05-28 MED ORDER — ACETAMINOPHEN 650 MG RE SUPP
650.0000 mg | Freq: Once | RECTAL | Status: AC
  Administered 2020-05-28: 650 mg via RECTAL

## 2020-05-28 MED ORDER — ATORVASTATIN CALCIUM 40 MG PO TABS
80.0000 mg | ORAL_TABLET | Freq: Every day | ORAL | Status: DC
  Administered 2020-05-29 – 2020-06-03 (×6): 80 mg via ORAL
  Filled 2020-05-28 (×7): qty 2

## 2020-05-28 MED ORDER — LACTATED RINGERS IV BOLUS (SEPSIS): 10.0000 mL | Freq: Once | INTRAVENOUS | Status: DC

## 2020-05-28 NOTE — H&P (Addendum)
History and Physical    Angela Mullins LKG:[id number] DOB: [birth year] DOA: 05/27/2020  PCP: Coolidge Breeze, FNP   Patient coming from: Home.   I have personally briefly reviewed patient's old medical records in [place]  Chief Complaint: Headache.  HPI: Angela Mullins is a 65 y.o. female with medical history significant of anxiety, GERD, hyperlipidemia, hypertension, vitamin D deficiency who is coming to the emergency department due to headache, similar to her migraines, but more intense than usual that has not responded to Relpax for the past 48 hours associated with nausea, vomiting and generalized weakness.  Her husband also stated in the ED that her mentation has been decreased. She had a colonoscopy on Tuesday with some polypectomies, but denies abdominal pain, diarrhea, constipation, melena hematochezia..  Wednesday was uneventful, but then developed symptoms of Thursday.  EMS was called, but subsequently they decided not to come to the ED.   No history of fever at home, but was febrile at 101 F in the emergency department.  No sore throat, cough, dyspnea, wheezing or hemoptysis.  No chest pain, palpitations, lower extremity edema.  No dysuria, frequency or hematuria.  ED Course: Initial vital signs were temperature 98.5 F, pulse 99, respirations 17, BP 170/93 mmHg O2 sat 98% on room air.  The patient received acetaminophen 650 mg p.o., 2000 mL of LR bolus, Zofran 4 mg IVP, hydromorphone 1 mg IVP x2, cefepime, metronidazole and vancomycin.  Lab work: Her CBC showed a white count of 19.3 with 90% neutrophils, hemoglobin 15.1 g/dL and platelets 273.  Normal PT, INR and PTT.  CMP showed a glucose of 148 mg/dL, total protein 8.3 g/dL and ALT of 40 units/L.  The rest of the values are normal.  Lactic acid was normal twice.  Lipase was 19 units/L.  Procalcitonin was elevated at 2.71 ng/mL.  Imaging: Portable chest radiograph showed mild residual coarsening of unknown acuity, likely chronic.   There was chronic blunting of the left costophrenic angle.  CT abdomen/pelvis without contrast showed mild retroperitoneal and peripancreatic fat stranding, mild apparent thickening of the bilateral adrenal glands with adjacent inflammatory stranding.  Colonic diverticulosis.  Aortic atherosclerosis.  CT head without contrast no acute intracranial pathology.  CTA head and neck did not show any large vessel occlusion or high grade correctable stenosis.  There was bulky calcified plaque about the left carotid bulb associated with a stenosis up to 40% by NASCET criteria.  There was emphysema with superimposed patchy opacities within the posterior left greater than the right lung that could reflect atelectasis or infection.  Her head CT venogram was negative.  Review of Systems: As per HPI otherwise all other systems reviewed and are negative.  Past Medical History:  Diagnosis Date  . Anxiety   . GERD (gastroesophageal reflux disease)   . Hypercholesteremia   . Hypertension   . Vitamin D deficiency    Past Surgical History:  Procedure Laterality Date  . CHOLECYSTECTOMY    . PELVIC FRACTURE SURGERY    . WISDOM TOOTH EXTRACTION     Social History  reports that she has quit smoking. She has never used smokeless tobacco. She reports current alcohol use. She reports that she does not use drugs.  Allergies  Allergen Reactions  . Demerol [Meperidine Hcl] Other (See Comments)    Does not help  . Quinine Derivatives Nausea And Vomiting   Family History  Problem Relation Age of Onset  . Alzheimer's disease Mother   . Heart disease Father   .  Diabetes Sister   . Heart disease Sister   . Hypertension Brother    Prior to Admission medications   Medication Sig Start Date End Date Taking? Authorizing Provider  aspirin EC 81 MG tablet Take 81 mg by mouth daily. Swallow whole.   Yes [provider]  atorvastatin (LIPITOR) 80 MG tablet Take 80 mg by mouth daily.   Yes [provider]  cetirizine (ZYRTEC) 10 MG tablet Take 10 mg by mouth daily.   Yes [provider]  clonazePAM (KLONOPIN) 0.5 MG tablet Take 0.5 mg by mouth 2 (two) times daily.   Yes [provider]  diphenhydrAMINE (BENADRYL) 25 mg capsule Take 25 mg by mouth every 8 (eight) hours as needed for itching (Swelling).   Yes [provider]  eletriptan (RELPAX) 40 MG tablet Take 40 mg by mouth every 2 (two) hours as needed for migraine. 05/26/20  Yes [provider]  ergocalciferol (VITAMIN D2) 1.25 MG (50000 UT) capsule Take 50,000 Units by mouth once a week.   Yes [provider]  fenofibrate micronized (LOFIBRA) 200 MG capsule Take 200 mg by mouth daily before breakfast.   Yes [provider]  Halcinonide (HALOG) 0.1 % OINT Apply 1 application topically daily as needed (on bite).   Yes [provider]  ibuprofen (ADVIL) 200 MG tablet Take 400 mg by mouth daily as needed for headache.   Yes [provider]  lisinopril (ZESTRIL) 10 MG tablet Take 10 mg by mouth daily.   Yes [provider]  Multiple Vitamins-Minerals (MULTIVITAMIN WITH MINERALS) tablet Take 1 tablet by mouth daily. 50 +   Yes [provider]  omeprazole (PRILOSEC) 40 MG capsule Take 40 mg by mouth 2 (two) times daily.   Yes [provider]  Potassium 99 MG TABS Take by mouth daily. Patient not taking: Reported on 05/27/2020    [provider]    Physical Exam: Vitals:   05/27/20 2300 05/27/20 2330 05/28/20 0000 05/28/20 0030  BP: (!) 151/81 (!) 152/79 (!) 163/79 (!) 153/83  Pulse: 82 92 94 89  Resp: 18 19 19 20   Temp:      TempSrc:      SpO2: 98% 98% 98% 98%  Weight:      Height:       Constitutional: NAD, calm, comfortable Eyes: PERRL, sclerae, lids and conjunctivae mildly injected. ENMT: Mucous membranes are dry.  Posterior pharynx clear of any exudate or lesions. Neck: normal, supple, no masses, no thyromegaly Respiratory:  Decreased breath sounds in bases, otherwise clear to auscultation bilaterally, no wheezing, no crackles. Normal respiratory effort. No accessory muscle use.  Cardiovascular: Regular rate and rhythm, no murmurs / rubs / gallops. No extremity edema. 2+ pedal pulses. No carotid bruits.  Abdomen: No distention.  Bowel sounds positive.  Soft, no tenderness, no masses palpated. No hepatosplenomegaly. Musculoskeletal: Mild generalized weakness.  No clubbing / cyanosis. Good ROM, no contractures. Normal muscle tone.  Skin: no acute rashes, lesions, ulcers on limited dermatological examination. Neurologic: CN 2-12 grossly intact. Sensation intact, DTR normal. Strength 5/5 in all 4.  Psychiatric: Somnolent after hydromorphone given in ED, but easily awakened, then alert and oriented x 3.  Labs on Admission: I have personally reviewed following labs and imaging studies  CBC: Recent Labs  Lab 05/27/20 1921 05/27/20 1933  WBC 19.3*  --   NEUTROABS 17.4*  --   HGB 15.1* 15.6*  HCT 45.7 46.0  MCV 89.6  --  PLT 273  --     Basic Metabolic Panel: Recent Labs  Lab 05/27/20 1921 05/27/20 1933  NA 140 143  K 3.7 3.8  CL 106 106  CO2 22  --   GLUCOSE 148* 148*  BUN 16 15  CREATININE 0.88 0.80  CALCIUM 10.1  --     GFR: Estimated Creatinine Clearance: 61.5 mL/min (by C-G formula based on SCr of 0.8 mg/dL).  Liver Function Tests: Recent Labs  Lab 05/27/20 1921  AST 28  ALT 49*  ALKPHOS 112  BILITOT 0.9  PROT 8.3*  ALBUMIN 4.2    Urine analysis: No results found for: COLORURINE, APPEARANCEUR, LABSPEC, PHURINE, GLUCOSEU, HGBUR, BILIRUBINUR, KETONESUR, PROTEINUR, UROBILINOGEN, NITRITE, LEUKOCYTESUR  Radiological Exams on Admission: CT Abdomen Pelvis Wo Contrast  Result Date: 05/27/2020 CLINICAL DATA:  Abdominal pain and fever EXAM: CT ABDOMEN AND PELVIS WITHOUT CONTRAST TECHNIQUE: Multidetector CT imaging of the abdomen and pelvis was performed following the standard protocol  without IV contrast. COMPARISON:  CT February 17, 2008 FINDINGS: Lower chest: Bibasilar atelectasis/scarring. Normal size heart. No significant pericardial effusion/thickening. Small hiatal hernia. Hepatobiliary: Unremarkable noncontrast appearance of the hepatic parenchyma. Gallbladder is surgically absent. No biliary ductal dilation. Pancreas: Mild retroperitoneal/peripancreatic fat stranding. No pancreatic ductal dilation. Spleen: Within normal limits Adrenals/Urinary Tract: Mild apparent thickening of the the bilateral adrenal glands with adjacent inflammatory stranding. No hydronephrosis. No nephrolithiasis. No contour deforming renal masses. The urinary bladder is grossly unremarkable for degree of distension. Stomach/Bowel: Small hiatal hernia otherwise the stomach is grossly unremarkable for degree of distension. No pathologic dilation of small bowel. Tiny appendicoliths in a non appendix. No suspicious colonic wall thickening or mass like lesions. Scattered colonic diverticulosis without findings of acute diverticulitis. Vascular/Lymphatic: Aortic atherosclerosis. No enlarged abdominal or pelvic lymph nodes. Reproductive: Uterus and bilateral adnexa are unremarkable. Other: No pneumoperitoneum. Musculoskeletal: Multilevel degenerative changes spine. No acute osseous abnormality. IMPRESSION: 1. Mild retroperitoneal/peripancreatic fat stranding nonspecific but possibly representing acute pancreatitis. Correlation with serum lipase is recommended. 2. Mild apparent thickening of the bilateral adrenal glands with adjacent inflammatory stranding, which is nonspecific but may represent adrenal congestion. 3. Colonic diverticulosis without findings of acute diverticulitis. 4. Small hiatal hernia. 5. Aortic atherosclerosis. Aortic Atherosclerosis (ICD10-I70.0). Electronically Signed   By: Dahlia Bailiff MD   On: 05/27/2020 22:01   CT HEAD WO CONTRAST  Result Date: 05/27/2020 CLINICAL DATA:  Headache.  Dysarthria  EXAM: CT HEAD WITHOUT CONTRAST TECHNIQUE: Contiguous axial images were obtained from the base of the skull through the vertex without intravenous contrast. COMPARISON:  None. FINDINGS: Brain: Ventricles and sulci are normal in size and configuration. There is no intracranial mass, hemorrhage, extra-axial fluid collection, or midline shift. Brain parenchyma appears unremarkable. No evident acute infarct. Vascular: No hyperdense vessel.  No evident vascular calcification. Skull: Bony calvarium appears intact. Sinuses/Orbits: There are small retention cysts in each inferior maxillary antrum. There is a small retention cyst in a posterior right ethmoid air cell. There is rightward deviation of the nasal septum. Orbits appear symmetric bilaterally. Other: Mastoid air cells are clear. There is debris in the right external auditory canal. IMPRESSION: Normal appearing brain parenchyma. No acute infarct. No mass or hemorrhage. Foci of mild paranasal sinus disease. Deviated nasal septum. Probable cerumen in the right external auditory canal. Electronically Signed   By: Lowella Grip III M.D.   On: 05/27/2020 20:00   DG Chest Port 1 View  Result Date: 05/27/2020 CLINICAL DATA:  Questionable sepsis - evaluate for abnormality EXAM:  PORTABLE CHEST 1 VIEW COMPARISON:  Radiograph 01/28/2006 FINDINGS: The cardiomediastinal contours are normal. Chronic blunting of left costophrenic angle. Mild interstitial coarsening. Pulmonary vasculature is normal. No consolidation, pleural effusion, or pneumothorax. No acute osseous abnormalities are seen. IMPRESSION: 1. Mild interstitial coarsening of unknown acuity, likely chronic. 2. Chronic blunting of the left costophrenic angle. Electronically Signed   By: Keith Rake M.D.   On: 05/27/2020 20:41    EKG: Independently reviewed.  Vent. rate 93 BPM PR interval 122 ms QRS duration 82 ms QT/QTcB 342/426 ms P-R-T axes 74 80 71 Sinus rhythm Borderline ST depression, diffuse  leads  Assessment/Plan Principal Problem:   Acute headache In the setting of SIRS (systemic inflammatory response syndrome) POA (HCC) No obvious source, but imaging suspicious for incipient pneumonia. Lower suspicion for meningitis since HA similar to migraine, but more intense. Will place in observation/telemetry. Continue IV hydration. Continue cefepime per pharmacy. Continue metronidazole 500 mg IVPB every 8 hours Continue vancomycin per pharmacy. Follow-up blood culture and sensitivity. Follow-up CBC and CMP.  Active Problems:   Hypercholesteremia with   Left carotid artery plaque and Aortic atherosclerosis (HCC) Continue atorvastatin 80 mg p.o. daily. Continue micronized fenofibrate or formulary equivalent. She ceased smoking last year. Interval follow-up with PCP.    Sinus bradycardia with two > 2 seconds pauses (while sleeping) Asymptomatic and NSR when awake. As needed hydromorphone has been held. Will continue to monitor. In or outpatient follow-up with cardiology.    Hypertension Continue lisinopril 10 mg p.o. daily. Monitor BP, renal function and electrolytes.    Hyperglycemia (nonfasting) Follow-up fasting glucose.    GERD (gastroesophageal reflux disease) Continue pantoprazole 40 mg p.o. daily.    DVT prophylaxis: Lovenox SQ. Code Status:   Full code. Family Communication: Disposition Plan:   Patient is from:  Home.  Anticipated DC to:  Home.  Anticipated DC date:  05/29/2020.  Anticipated DC barriers: Clinical status.  Consults called: Admission status:  Observation/telemetry.   Severity of Illness: High severity after presenting with severe headache and fever.  The patient will need to remain in the hospital for further evaluation and possible IV antibiotic therapy for 2 to 3 days.  Reubin Milan MD [facility]  How to contact the [facility] Attending or Consulting provider [place] or covering provider during after hours [place], for this  patient?   1. Check the care team in [facility] and look for a) attending/consulting [facility] provider listed and b) the [facility] team listed 2. Log into [email or web address] and use [place]'s universal password to access. If you do not have the password, please contact the hospital operator. 3. Locate the [facility] provider you are looking for under [facility] and page to a number that you can be directly reached. 4. If you still have difficulty reaching the provider, please page the [facility] (Director on Call) for the Hospitalists listed on amion for assistance.  05/28/2020, 2:55 AM   This document was prepared using Dragon voice recognition software may contain some unintended transcription errors.

## 2020-05-28 NOTE — ED Notes (Signed)
Present for LP with Dr. Rogene Houston. Attempted x1, unsuccessful. Pt supine at this time. VSS post procedure.

## 2020-05-28 NOTE — Progress Notes (Signed)
Day shift reported that msg was sent to am hospitalitis with no response in regards to expired order for LR @150  that has expired to see if he wanted to renew. I  Sent msg to pm on call Tamala Ser @1950  who put new order for new LR to be continued @150  continuous with Toradol added Every 6 hours PRN :Will continue to monitor. Pt stable visiting with husband.

## 2020-05-28 NOTE — Progress Notes (Signed)
Patient seen and examined.  Admitted after midnight secondary to intractable headache and altered mental status.  Patient found to be febrile 101 in the emergency department; there have been reports of associated nausea/vomiting at home, no chest pain, no cough, no abdominal pain, no diarrhea, no melena or hematochezia.  Patient with a prior history of severe migraines in the past, but has been over 5 years without any incidents.  Prior to presentation to the emergency department has been using Relpax without any relief.  Currently hemodynamically stable, somnolent, still expressing mild head discomfort.  No photophobia, no neck pain.  CT head without acute intracranial abnormalities.  Please refer to H&P written by Dr. Olevia Bowens for further info/details.  Plan: -Continue IV fluids, electrolyte repletion and supportive care -Will be judicious about analgesics for headaches -For now we will continue antibiotics but will narrow based on response and culture results.  MRSA PCR negative vancomycin will be discontinued.     Barton Dubois MD [phone]

## 2020-05-28 NOTE — Progress Notes (Signed)
performed [employer] Screen. Passes notified On Call Dr. Olevia Bowens Awaiting orders. Pt stable tolerated well

## 2020-05-28 NOTE — ED Provider Notes (Signed)
[facility] EMERGENCY DEPARTMENT Provider Note   CSN: [id number] Arrival date & time: 05/27/20  1909     History Chief Complaint  Patient presents with  . severe headache    Incomprehensible speech/ dysphagia    Angela Mullins is a 65 y.o. female.  Patient brought in by spouse.  Patient was well until Thursday.  When she developed a sudden severe headache.  Patient has a history of migraines but this seemed more severe than usual.  She also had some altered mental status associated with it and could not deal with the headache.  There is been some nausea and vomiting that does occur with her migraines.  No known fever.  Just a few days ago patient underwent upper endoscopy and colonoscopy had several polyps removed.  But no complaint of abdominal pain.  EMS was called out to the house on Thursday was evaluated and then they decided not to bring her in.  Patient seen or discussed with her primary care provider yesterday.  She was taking her migraine medication.  And was told that if not better until today to come in.  No history of fever.  Patient's oral temp was normal but rectal temp here was 101.  Blood pressure initially upon presentation was 178/92 with a heart rate of 99 respiratory rate was 18-24.  Based on this sepsis protocol was initiated.  And due to the headache there was some concern about potential bleed or perhaps meningitis.        Past Medical History:  Diagnosis Date  . Anxiety   . GERD (gastroesophageal reflux disease)   . Hypercholesteremia   . Hypertension   . Vitamin D deficiency     Patient Active Problem List   Diagnosis Date Noted  . Acute headache 05/28/2020  . Dysphagia 05/10/2020  . Throat discomfort 05/10/2020  . GERD (gastroesophageal reflux disease) 05/10/2020    Past Surgical History:  Procedure Laterality Date  . CHOLECYSTECTOMY    . PELVIC FRACTURE SURGERY    . WISDOM TOOTH EXTRACTION       OB History   No obstetric history on file.      Family History  Problem Relation Age of Onset  . Alzheimer's disease Mother   . Heart disease Father   . Diabetes Sister   . Heart disease Sister   . Hypertension Brother     Social History   Tobacco Use  . Smoking status: Former Research scientist (life sciences)  . Smokeless tobacco: Never Used  . Tobacco comment: Quit 2021  Vaping Use  . Vaping Use: Never used  Substance Use Topics  . Alcohol use: Yes    Comment: occasionally  . Drug use: Never    Home Medications Prior to Admission medications   Medication Sig Start Date End Date Taking? Authorizing Provider  aspirin EC 81 MG tablet Take 81 mg by mouth daily. Swallow whole.   Yes [provider]  atorvastatin (LIPITOR) 80 MG tablet Take 80 mg by mouth daily.   Yes [provider]  cetirizine (ZYRTEC) 10 MG tablet Take 10 mg by mouth daily.   Yes [provider]  clonazePAM (KLONOPIN) 0.5 MG tablet Take 0.5 mg by mouth 2 (two) times daily.   Yes [provider]  diphenhydrAMINE (BENADRYL) 25 mg capsule Take 25 mg by mouth every 8 (eight) hours as needed for itching (Swelling).   Yes [provider]  eletriptan (RELPAX) 40 MG tablet Take 40 mg by mouth every 2 (two) hours as  needed for migraine. 05/26/20  Yes [provider]  ergocalciferol (VITAMIN D2) 1.25 MG (50000 UT) capsule Take 50,000 Units by mouth once a week.   Yes [provider]  fenofibrate micronized (LOFIBRA) 200 MG capsule Take 200 mg by mouth daily before breakfast.   Yes [provider]  Halcinonide (HALOG) 0.1 % OINT Apply 1 application topically daily as needed (on bite).   Yes [provider]  ibuprofen (ADVIL) 200 MG tablet Take 400 mg by mouth daily as needed for headache.   Yes [provider]  lisinopril (ZESTRIL) 10 MG tablet Take 10 mg by mouth daily.   Yes [provider]  Multiple Vitamins-Minerals (MULTIVITAMIN WITH MINERALS) tablet Take 1 tablet by mouth daily. 50 +   Yes  [provider]  omeprazole (PRILOSEC) 40 MG capsule Take 40 mg by mouth 2 (two) times daily.   Yes [provider]  Potassium 99 MG TABS Take by mouth daily. Patient not taking: Reported on 05/27/2020    [provider]    Allergies    Demerol [meperidine hcl] and Quinine derivatives  Review of Systems   Review of Systems  Unable to perform ROS: Mental status change    Physical Exam Updated Vital Signs BP (!) 153/83   Pulse 89   Temp 98.5 F (36.9 C) (Oral)   Resp 20   Ht 1.619 m (5' 3.75")   Wt 58.1 kg   SpO2 98%   BMI 22.14 kg/m   Physical Exam Vitals and nursing note reviewed.  Constitutional:      General: She is not in acute distress.    Appearance: She is well-developed. She is ill-appearing.  HENT:     Head: Normocephalic and atraumatic.  Eyes:     Extraocular Movements: Extraocular movements intact.     Conjunctiva/sclera: Conjunctivae normal.     Pupils: Pupils are equal, round, and reactive to light.  Cardiovascular:     Rate and Rhythm: Normal rate and regular rhythm.     Heart sounds: No murmur heard.   Pulmonary:     Effort: Pulmonary effort is normal. No respiratory distress.     Breath sounds: Normal breath sounds.  Abdominal:     General: There is distension.     Palpations: Abdomen is soft.     Tenderness: There is no abdominal tenderness.  Musculoskeletal:        General: No swelling.     Cervical back: Neck supple. No rigidity.  Skin:    General: Skin is warm and dry.     Capillary Refill: Capillary refill takes less than 2 seconds.  Neurological:     Mental Status: She is alert.     Comments: Patient with altered mental status.  But will verbalize no slurred speech.  Will follow commands like moving arms and legs.  Patient keeps complaining of posterior headache pain.     ED Results / Procedures / Treatments   Labs (all labs ordered are listed, but only abnormal results are displayed) Labs Reviewed  CBC -  Abnormal; Notable for the following components:      Result Value   WBC 19.3 (*)    Hemoglobin 15.1 (*)    All other components within normal limits  DIFFERENTIAL - Abnormal; Notable for the following components:   Neutro Abs 17.4 (*)    Abs Immature Granulocytes 0.16 (*)    All other components within normal limits  COMPREHENSIVE METABOLIC PANEL - Abnormal; Notable for the  following components:   Glucose, Bld 148 (*)    Total Protein 8.3 (*)    ALT 49 (*)    All other components within normal limits  CBG MONITORING, ED - Abnormal; Notable for the following components:   Glucose-Capillary 136 (*)    All other components within normal limits  I-STAT CHEM 8, ED - Abnormal; Notable for the following components:   Glucose, Bld 148 (*)    TCO2 21 (*)    Hemoglobin 15.6 (*)    All other components within normal limits  RESP PANEL BY RT-PCR (FLU A&B, COVID) ARPGX2  CULTURE, BLOOD (ROUTINE X 2)  CULTURE, BLOOD (ROUTINE X 2)  URINE CULTURE  PROTIME-INR  APTT  LACTIC ACID, PLASMA  LACTIC ACID, PLASMA  LIPASE, BLOOD  PROCALCITONIN  URINALYSIS, ROUTINE W REFLEX MICROSCOPIC  CBC WITH DIFFERENTIAL/PLATELET  COMPREHENSIVE METABOLIC PANEL    EKG EKG Interpretation  Date/Time:  Saturday May 27 2020 19:22:34 EDT Ventricular Rate:  93 PR Interval:  122 QRS Duration: 82 QT Interval:  342 QTC Calculation: 426 R Axis:   80 Text Interpretation: Sinus rhythm Borderline ST depression, diffuse leads No previous ECGs available Confirmed by Fredia Sorrow [phone]) on 05/27/2020 7:34:07 PM   Radiology CT Abdomen Pelvis Wo Contrast  Result Date: 05/27/2020 CLINICAL DATA:  Abdominal pain and fever EXAM: CT ABDOMEN AND PELVIS WITHOUT CONTRAST TECHNIQUE: Multidetector CT imaging of the abdomen and pelvis was performed following the standard protocol without IV contrast. COMPARISON:  CT February 17, 2008 FINDINGS: Lower chest: Bibasilar atelectasis/scarring. Normal size heart. No significant  pericardial effusion/thickening. Small hiatal hernia. Hepatobiliary: Unremarkable noncontrast appearance of the hepatic parenchyma. Gallbladder is surgically absent. No biliary ductal dilation. Pancreas: Mild retroperitoneal/peripancreatic fat stranding. No pancreatic ductal dilation. Spleen: Within normal limits Adrenals/Urinary Tract: Mild apparent thickening of the the bilateral adrenal glands with adjacent inflammatory stranding. No hydronephrosis. No nephrolithiasis. No contour deforming renal masses. The urinary bladder is grossly unremarkable for degree of distension. Stomach/Bowel: Small hiatal hernia otherwise the stomach is grossly unremarkable for degree of distension. No pathologic dilation of small bowel. Tiny appendicoliths in a non appendix. No suspicious colonic wall thickening or mass like lesions. Scattered colonic diverticulosis without findings of acute diverticulitis. Vascular/Lymphatic: Aortic atherosclerosis. No enlarged abdominal or pelvic lymph nodes. Reproductive: Uterus and bilateral adnexa are unremarkable. Other: No pneumoperitoneum. Musculoskeletal: Multilevel degenerative changes spine. No acute osseous abnormality. IMPRESSION: 1. Mild retroperitoneal/peripancreatic fat stranding nonspecific but possibly representing acute pancreatitis. Correlation with serum lipase is recommended. 2. Mild apparent thickening of the bilateral adrenal glands with adjacent inflammatory stranding, which is nonspecific but may represent adrenal congestion. 3. Colonic diverticulosis without findings of acute diverticulitis. 4. Small hiatal hernia. 5. Aortic atherosclerosis. Aortic Atherosclerosis (ICD10-I70.0). Electronically Signed   By: Dahlia Bailiff MD   On: 05/27/2020 22:01   CT HEAD WO CONTRAST  Result Date: 05/27/2020 CLINICAL DATA:  Headache.  Dysarthria EXAM: CT HEAD WITHOUT CONTRAST TECHNIQUE: Contiguous axial images were obtained from the base of the skull through the vertex without  intravenous contrast. COMPARISON:  None. FINDINGS: Brain: Ventricles and sulci are normal in size and configuration. There is no intracranial mass, hemorrhage, extra-axial fluid collection, or midline shift. Brain parenchyma appears unremarkable. No evident acute infarct. Vascular: No hyperdense vessel.  No evident vascular calcification. Skull: Bony calvarium appears intact. Sinuses/Orbits: There are small retention cysts in each inferior maxillary antrum. There is a small retention cyst in a posterior right ethmoid air cell. There is rightward deviation of the nasal  septum. Orbits appear symmetric bilaterally. Other: Mastoid air cells are clear. There is debris in the right external auditory canal. IMPRESSION: Normal appearing brain parenchyma. No acute infarct. No mass or hemorrhage. Foci of mild paranasal sinus disease. Deviated nasal septum. Probable cerumen in the right external auditory canal. Electronically Signed   By: Lowella Grip III M.D.   On: 05/27/2020 20:00   DG Chest Port 1 View  Result Date: 05/27/2020 CLINICAL DATA:  Questionable sepsis - evaluate for abnormality EXAM: PORTABLE CHEST 1 VIEW COMPARISON:  Radiograph 01/28/2006 FINDINGS: The cardiomediastinal contours are normal. Chronic blunting of left costophrenic angle. Mild interstitial coarsening. Pulmonary vasculature is normal. No consolidation, pleural effusion, or pneumothorax. No acute osseous abnormalities are seen. IMPRESSION: 1. Mild interstitial coarsening of unknown acuity, likely chronic. 2. Chronic blunting of the left costophrenic angle. Electronically Signed   By: Keith Rake M.D.   On: 05/27/2020 20:41    Procedures .Lumbar Puncture  Date/Time: 05/28/2020 1:50 AM Performed by: Fredia Sorrow, MD Authorized by: Fredia Sorrow, MD   Consent:    Consent obtained:  Verbal   Consent given by:  Spouse   Risks, benefits, and alternatives were discussed: yes     Risks discussed:  Bleeding, infection,  headache and repeat procedure   Alternatives discussed:  No treatment Universal protocol:    Procedure explained and questions answered to patient or proxy's satisfaction: yes     Relevant documents present and verified: yes     Test results available: yes     Imaging studies available: yes     Required blood products, implants, devices, and special equipment available: yes     Immediately prior to procedure a time out was called: yes     Site/side marked: yes     Patient identity confirmed:  Verbally with patient and arm band Pre-procedure details:    Procedure purpose:  Diagnostic   Preparation: Patient was prepped and draped in usual sterile fashion   Anesthesia:    Anesthesia method:  Local infiltration   Local anesthetic:  Lidocaine 1% w/o epi Procedure details:    Lumbar space:  L4-L5 interspace   Patient position:  R lateral decubitus   Needle gauge:  20   Ultrasound guidance: no     Number of attempts:  3   Fluid appearance:  Bloody Post-procedure details:    Puncture site:  Adhesive bandage applied and direct pressure applied   Procedure completion:  Tolerated well, no immediate complications Comments:     Patient was a bit squirmy.  Due to some of her altered mental status.  Was able to get the needle through the space.  Originally got some bloody type discharge.  Went to a level above and could not through the space.  So terminated the procedure.   CRITICAL CARE Performed by: Fredia Sorrow Total critical care time: 90 minutes Critical care time was exclusive of separately billable procedures and treating other patients. Critical care was necessary to treat or prevent imminent or life-threatening deterioration. Critical care was time spent personally by me on the following activities: development of treatment plan with patient and/or surrogate as well as nursing, discussions with consultants, evaluation of patient's response to treatment, examination of patient,  obtaining history from patient or surrogate, ordering and performing treatments and interventions, ordering and review of laboratory studies, ordering and review of radiographic studies, pulse oximetry and re-evaluation of patient's condition.   Medications Ordered in ED Medications  lactated ringers infusion ( Intravenous New Bag/Given  05/27/20 2119)  ceFEPIme (MAXIPIME) 2 g in sodium chloride 0.9 % 100 mL IVPB (has no administration in time range)  vancomycin (VANCOREADY) IVPB 750 mg/150 mL (has no administration in time range)  acetaminophen (TYLENOL) tablet 650 mg (has no administration in time range)    Or  acetaminophen (TYLENOL) suppository 650 mg (has no administration in time range)  ondansetron (ZOFRAN) tablet 4 mg (has no administration in time range)    Or  ondansetron (ZOFRAN) injection 4 mg (has no administration in time range)  metroNIDAZOLE (FLAGYL) IVPB 500 mg (has no administration in time range)  acetaminophen (TYLENOL) suppository 650 mg (has no administration in time range)  lactated ringers bolus 1,000 mL (0 mLs Intravenous Stopped 05/27/20 2117)    And  lactated ringers bolus 1,000 mL (0 mLs Intravenous Stopped 05/27/20 2117)  ceFEPIme (MAXIPIME) 2 g in sodium chloride 0.9 % 100 mL IVPB (0 g Intravenous Stopped 05/27/20 2117)  metroNIDAZOLE (FLAGYL) IVPB 500 mg (0 mg Intravenous Stopped 05/27/20 2117)  vancomycin (VANCOCIN) IVPB 1000 mg/200 mL premix (0 mg Intravenous Stopped 05/27/20 2115)  ondansetron (ZOFRAN) injection 4 mg (4 mg Intravenous Given 05/27/20 2021)  HYDROmorphone (DILAUDID) injection 1 mg (1 mg Intravenous Given 05/27/20 2021)  HYDROmorphone (DILAUDID) injection 1 mg (1 mg Intravenous Given 05/28/20 0145)    ED Course  I have reviewed the triage vital signs and the nursing notes.  Pertinent labs & imaging results that were available during my care of the patient were reviewed by me and considered in my medical decision making (see chart for details).     MDM Rules/Calculators/A&P                          Patient discussed with hospitalist.  Unfortunately lumbar puncture attempts were unsuccessful.  Patient may need ultrasound-guided.  But patient has been covered with broad-spectrum antibiotics for possible sepsis.  Did have a leukocytosis did have a fever.  Lactic acids were not elevated.  COVID testing influenza testing negative.  Blood cultures are pending.  Head CT without any acute findings.  CT abdomen chest x-ray without any acute findings.  CT abdomen was done because patient recently had upper and lower endoscopy just to make sure there is no complicating factor secondary to that because she did have some polyps biopsied.  We will go ahead and do CT angio head neck and CT venogram of the head.  To further evaluate.  Patient has been hypertensive.  His most recent blood pressures are around 147 systolic so was some concern may be whether there was due to the hypertension because of the altered mental status reason for the CT venogram.  Patient has never been hypotensive.  Has been febrile.  Temp documented at 101.  Nurses gonorrhea check it.  Because she felt hot during the LP attempts.  Will be redosed with pain medication for the headache.  Patient overall without any significant improvement.  Is been this way since Thursday patient spouse thought initially it may be migraine related because patient does have a history of migraines and does get some nausea and vomiting with that.  But the seem to be more severe.  Patient with some improvement after the antibiotics.  But still has certain degree of confusion still complaining of headache.  Did not seem to have any neck or lumbar spine tenderness.  Patient kind of with an encephalopathy that at this point in time is unexplained.  [place] where  we will be following on the CT results and they have put their name on the patient for admission Final Clinical Impression(s) / ED Diagnoses Final  diagnoses:  Sepsis, due to unspecified organism, unspecified whether acute organ dysfunction present [facility])    Rx / DC Orders ED Discharge Orders    None       Fredia Sorrow, MD 05/28/20 0157

## 2020-05-28 NOTE — Progress Notes (Signed)
Per Davy Pique at telemetry patient had a 2.02 second pause and a 2.29 second pause.  Dr. Olevia Bowens notified.

## 2020-05-29 ENCOUNTER — Inpatient Hospital Stay (HOSPITAL_COMMUNITY)

## 2020-05-29 ENCOUNTER — Encounter (HOSPITAL_COMMUNITY): Payer: Self-pay | Admitting: Gastroenterology

## 2020-05-29 DIAGNOSIS — I10 Essential (primary) hypertension: Secondary | ICD-10-CM

## 2020-05-29 DIAGNOSIS — J439 Emphysema, unspecified: Secondary | ICD-10-CM

## 2020-05-29 DIAGNOSIS — G039 Meningitis, unspecified: Secondary | ICD-10-CM

## 2020-05-29 DIAGNOSIS — E7800 Pure hypercholesterolemia, unspecified: Secondary | ICD-10-CM

## 2020-05-29 DIAGNOSIS — R739 Hyperglycemia, unspecified: Secondary | ICD-10-CM

## 2020-05-29 DIAGNOSIS — K219 Gastro-esophageal reflux disease without esophagitis: Secondary | ICD-10-CM

## 2020-05-29 LAB — CSF CELL COUNT WITH DIFFERENTIAL
Eosinophils, CSF: 0 % (ref 0–1)
Lymphs, CSF: 44 % (ref 40–80)
Monocyte-Macrophage-Spinal Fluid: 11 % — ABNORMAL LOW (ref 15–45)
RBC Count, CSF: 69 /mm3 — ABNORMAL HIGH
Segmented Neutrophils-CSF: 45 % — ABNORMAL HIGH (ref 0–6)
Tube #: 4
WBC, CSF: 318 /mm3 (ref 0–5)

## 2020-05-29 LAB — PROTEIN AND GLUCOSE, CSF
Glucose, CSF: 29 mg/dL — CL (ref 40–70)
Total  Protein, CSF: 97 mg/dL — ABNORMAL HIGH (ref 15–45)

## 2020-05-29 LAB — CRYPTOCOCCAL ANTIGEN, CSF: Crypto Ag: NEGATIVE

## 2020-05-29 LAB — VITAMIN B12: Vitamin B-12: 244 pg/mL (ref 180–914)

## 2020-05-29 LAB — HIV ANTIBODY (ROUTINE TESTING W REFLEX): HIV Screen 4th Generation wRfx: NONREACTIVE

## 2020-05-29 MED ORDER — LIDOCAINE HCL (PF) 2 % IJ SOLN
INTRAMUSCULAR | Status: AC
  Administered 2020-05-29: 5 mL
  Filled 2020-05-29: qty 10

## 2020-05-29 MED ORDER — VANCOMYCIN HCL IN DEXTROSE 1-5 GM/200ML-% IV SOLN
1000.0000 mg | INTRAVENOUS | Status: DC
  Administered 2020-05-30 – 2020-05-31 (×2): 1000 mg via INTRAVENOUS
  Filled 2020-05-29 (×2): qty 200

## 2020-05-29 MED ORDER — DIHYDROERGOTAMINE MESYLATE 1 MG/ML IJ SOLN
1.0000 mg | Freq: Once | INTRAMUSCULAR | Status: AC
  Administered 2020-05-29: 1 mg via INTRAVENOUS
  Filled 2020-05-29: qty 1

## 2020-05-29 MED ORDER — SODIUM CHLORIDE 0.9 % IV SOLN: INTRAVENOUS | Status: DC

## 2020-05-29 MED ORDER — SUMATRIPTAN SUCCINATE 50 MG PO TABS
50.0000 mg | ORAL_TABLET | ORAL | Status: DC | PRN
  Administered 2020-05-30 – 2020-06-03 (×13): 50 mg via ORAL
  Filled 2020-05-29 (×13): qty 1

## 2020-05-29 MED ORDER — SODIUM CHLORIDE 0.9 % IV SOLN: 2.0000 g | INTRAVENOUS | Status: DC

## 2020-05-29 MED ORDER — SODIUM CHLORIDE 0.9 % IV SOLN: 10.0000 mg/kg | Freq: Three times a day (TID) | INTRAVENOUS | Status: DC

## 2020-05-29 MED ORDER — POVIDONE-IODINE (APLICARE-BETADINE) 10% STERILE
22.5000 mL | Freq: Once | CUTANEOUS | Status: AC
  Administered 2020-05-29: 22.5 mL via TOPICAL

## 2020-05-29 MED ORDER — SODIUM CHLORIDE 0.9 % IV SOLN
2.0000 g | Freq: Two times a day (BID) | INTRAVENOUS | Status: DC
  Administered 2020-05-29 – 2020-06-03 (×10): 2 g via INTRAVENOUS
  Filled 2020-05-29 (×11): qty 20

## 2020-05-29 MED ORDER — LACTATED RINGERS IV SOLN: INTRAVENOUS | Status: DC

## 2020-05-29 MED ORDER — DEXTROSE 5 % IV SOLN
10.0000 mg/kg | Freq: Three times a day (TID) | INTRAVENOUS | Status: DC
  Administered 2020-05-29 – 2020-06-01 (×9): 580 mg via INTRAVENOUS
  Filled 2020-05-29 (×13): qty 11.6

## 2020-05-29 MED ORDER — VANCOMYCIN HCL 1250 MG/250ML IV SOLN
1250.0000 mg | Freq: Once | INTRAVENOUS | Status: AC
  Administered 2020-05-29: 1250 mg via INTRAVENOUS
  Filled 2020-05-29: qty 250

## 2020-05-29 MED ORDER — ELETRIPTAN HYDROBROMIDE 20 MG PO TABS: 20.0000 mg | ORAL_TABLET | ORAL | Status: DC | PRN

## 2020-05-29 MED ORDER — DOXYCYCLINE HYCLATE 100 MG PO TABS
100.0000 mg | ORAL_TABLET | Freq: Two times a day (BID) | ORAL | Status: DC
  Administered 2020-05-29 – 2020-06-03 (×10): 100 mg via ORAL
  Filled 2020-05-29 (×10): qty 1

## 2020-05-29 MED ORDER — DOXYCYCLINE HYCLATE 100 MG PO TABS
100.0000 mg | ORAL_TABLET | Freq: Two times a day (BID) | ORAL | Status: DC
  Administered 2020-05-29: 100 mg via ORAL
  Filled 2020-05-29: qty 1

## 2020-05-29 NOTE — Progress Notes (Signed)
Pharmacy Antibiotic Note  Angela Mullins is a 65 y.o. female admitted on 05/27/2020 with sepsis/meningitis.  Pharmacy has been consulted for Ceftriaxone, vancomycin and acyclovir dosing.  Plan: Vancomycin 1250mg  IV loading dose, then 1000mg  IV q24h for AUC 448 Ceftriaxone 2gm IV q12h Acyclovir 10mg /kg (580mg ) IV q8h F/U cxs and clinical progress Monitor V/S, labs  Height: 5' 3.75" (161.9 cm) Weight: 58.1 kg (128 lb) IBW/kg (Calculated) : 54.13  Temp (24hrs), Avg:98.1 F (36.7 C), Min:97.6 F (36.4 C), Max:98.9 F (37.2 C)  Recent Labs  Lab 05/27/20 1921 05/27/20 1933 05/27/20 2239 05/28/20 0438  WBC 19.3*  --   --  14.8*  CREATININE 0.88 0.80  --  0.66  LATICACIDVEN 1.3  --  0.8  --     Estimated Creatinine Clearance: 61.5 mL/min (by C-G formula based on SCr of 0.66 mg/dL).    Allergies  Allergen Reactions  . Demerol [Meperidine Hcl] Other (See Comments)    Does not help  . Quinine Derivatives Nausea And Vomiting    Antimicrobials this admission:  cefepime 5/14 >> 5/16 Ceftriaxone 5/16>> Vancomycin 5/14 >> 5/15 restarted 5/16>> 5/14 metronidazole >>5/16 Doxycycline 5/16>> Acyclovir 5/16>>  Microbiology results: 5/14 BCx: ngtd 5/15 UCx: sent  5/14 Resp Panel: negative 5/15 MRSA PCR is negative Thank you for allowing pharmacy to be a part of this patient's care.  Isac Sarna, BS [place], [place] Clinical Pharmacist Pager [phone] 05/29/2020 5:16 PM

## 2020-05-29 NOTE — Progress Notes (Signed)
Informed consent obtained and placed in medical chart. Patient off of floor to have lumbar puncture performed

## 2020-05-29 NOTE — Sedation Documentation (Signed)
Patient tolerated lumbar puncture procedure well and fluid collected and sent to lab at this time for processing. PT returned to inpatient bed assignment at this time via stretcher. PT verbalized understanding of post procedure instructions which includes laying flat for 4 hours post lumbar puncture.

## 2020-05-29 NOTE — Plan of Care (Signed)

## 2020-05-29 NOTE — Progress Notes (Signed)
Date and time results received: 05/29/20 1701 (use smartphrase ".now" to insert current time)  Test: CSF GLUCOSE  Critical Value: 29  Name of Provider Notified: DR. MADERA   Orders Received? Or Actions Taken?:  AWAITING NEW ORDERS

## 2020-05-29 NOTE — Progress Notes (Signed)
PROGRESS NOTE    Angela Mullins  [id number] DOB: 1955/06/22 DOA: 05/27/2020 PCP: Coolidge Breeze, FNP   Chief Complaint  Patient presents with  . severe headache    Incomprehensible speech/ dysphagia    Brief Narrative:  As per H&P written by Dr. Olevia Bowens on 05/28/20 Angela Mullins is a 65 y.o. female with medical history significant of anxiety, GERD, hyperlipidemia, hypertension, vitamin D deficiency who is coming to the emergency department due to headache, similar to her migraines, but more intense than usual that has not responded to Relpax for the past 48 hours associated with nausea, vomiting and generalized weakness.  Her husband also stated in the ED that her mentation has been decreased. She had a colonoscopy on Tuesday with some polypectomies, but denies abdominal pain, diarrhea, constipation, melena hematochezia..  Wednesday was uneventful, but then developed symptoms of Thursday.  EMS was called, but subsequently they decided not to come to the ED.   No history of fever at home, but was febrile at 101 F in the emergency department.  No sore throat, cough, dyspnea, wheezing or hemoptysis.  No chest pain, palpitations, lower extremity edema.  No dysuria, frequency or hematuria.  ED Course: Initial vital signs were temperature 98.5 F, pulse 99, respirations 17, BP 170/93 mmHg O2 sat 98% on room air.  The patient received acetaminophen 650 mg p.o., 2000 mL of LR bolus, Zofran 4 mg IVP, hydromorphone 1 mg IVP x2, cefepime, metronidazole and vancomycin.  Assessment & Plan: 1-Acute headache and photophobia -also complaining of neck pain -LP suggesting meningitis  -empiric acyclovir, rocephin and vancomycin ordered -will provide ivf's and supportive care  2-hx of migraine -dihydroergotamine X 1 ordered -continue tylenol and PRN toradol -if pain continues will give steroids  3-GERD -continue PPI  4-HLD -continue lipitor  5-HTN -continue lisinopril  6-hyperglycemia -will  follow CBG's -no prior hx of diabetes   7-hx of emphysema -no wheezing on exam -continue PRN bronchodilators and oxygen supplementation.  8-dehydration and hypokalemia -IVF's given -will maintain adequate hydration and replete electrolytes as needed.  9-insect -patient reported insect bite about 1.5 week ago -will use oral doxycycline empirically.  DVT prophylaxis: -lovenox Code Status: Full Code Family Communication: no family at bedside. Disposition:   Status is: Inpatient   Dispo: The patient is from:home              Anticipated d/c is to: home              Patient currently not medically stable for discharge. Continue supportive care and IV antibiotics for suspected meningitis.    Difficult to place patient no    Consultants:   Neurology service.    Procedures:  See below for x-ray reprots -LP 05/29/20   Antimicrobials:  MRSA PCR negative Work up and LP suggesting meningitis: patient on rocephin, vancomycin and acyclovir.   Subjective: No nausea or vomiting.  Oriented x3 continue complaining of intermittent headaches, photophobia and Candida malaise.  Having neck pain mid while attempting to touch her chest with her chin.  Objective: Vitals:   05/29/20 0108 05/29/20 0421 05/29/20 1317 05/29/20 1516  BP: 137/79 139/85 (!) 146/77 (!) 166/90  Pulse: 73 78 70 72  Resp: 20 20 16 18   Temp: 97.7 F (36.5 C) 98.1 F (36.7 C) 98.9 F (37.2 C) 98.2 F (36.8 C)  TempSrc: Oral Oral  Oral  SpO2: 99% 99% 98% 100%  Weight:      Height:  Intake/Output Summary (Last 24 hours) at 05/29/2020 1652 Last data filed at 05/29/2020 1315 Gross per 24 hour  Intake 3164.02 ml  Output 500 ml  Net 2664.02 ml   Filed Weights   05/27/20 1923  Weight: 58.1 kg    Examination:  General exam: Afebrile, reports no chest pain, no nausea, no vomiting.  Oriented x3, following commands appropriately.  Still with ongoing intermittent headaches and photophobia.  Patient  also expressed neck pain when trying to touch her chest with chin. Respiratory system: Clear to auscultation. Respiratory effort normal. Cardiovascular system: S1 & S2 heard, RRR. No JVD, murmurs, rubs, gallops or clicks. No pedal edema. Gastrointestinal system: Abdomen is nondistended, soft and nontender. No organomegaly or masses felt. Normal bowel sounds heard. Central nervous system: Alert and oriented. No focal neurological deficits. Extremities: no cyanosis, no clubbing, no edema.  Skin: No petechiae.  Psychiatry: Judgement and insight appear normal. Mood & affect appropriate.     Data Reviewed: I have personally reviewed following labs and imaging studies  CBC: Recent Labs  Lab 05/27/20 1921 05/27/20 1933 05/28/20 0438  WBC 19.3*  --  14.8*  NEUTROABS 17.4*  --  12.6*  HGB 15.1* 15.6* 11.9*  HCT 45.7 46.0 37.2  MCV 89.6  --  92.1  PLT 273  --  623    Basic Metabolic Panel: Recent Labs  Lab 05/27/20 1921 05/27/20 1933 05/28/20 0438  NA 140 143 139  K 3.7 3.8 3.8  CL 106 106 105  CO2 22  --  26  GLUCOSE 148* 148* 127*  BUN 16 15 12   CREATININE 0.88 0.80 0.66  CALCIUM 10.1  --  8.8*    GFR: Estimated Creatinine Clearance: 61.5 mL/min (by C-G formula based on SCr of 0.66 mg/dL).  Liver Function Tests: Recent Labs  Lab 05/27/20 1921 05/28/20 0438  AST 28 45*  ALT 49* 66*  ALKPHOS 112 151*  BILITOT 0.9 0.7  PROT 8.3* 5.8*  ALBUMIN 4.2 2.8*    CBG: Recent Labs  Lab 05/27/20 1934  GLUCAP 136*     Recent Results (from the past 240 hour(s))  SARS CORONAVIRUS 2 (TAT 6-24 HRS) Nasopharyngeal Nasopharyngeal Swab     Status: None   Collection Time: 05/22/20  9:52 AM   Specimen: Nasopharyngeal Swab  Result Value Ref Range Status   SARS Coronavirus 2 NEGATIVE NEGATIVE Final    Comment: (NOTE) SARS-CoV-2 target nucleic acids are NOT DETECTED.  The SARS-CoV-2 RNA is generally detectable in upper and lower respiratory specimens during the acute phase  of infection. Negative results do not preclude SARS-CoV-2 infection, do not rule out co-infections with other pathogens, and should not be used as the sole basis for treatment or other patient management decisions. Negative results must be combined with clinical observations, patient history, and epidemiological information. The expected result is Negative.  Fact Sheet for Patients: SugarRoll.be  Fact Sheet for Healthcare Providers: https://www.woods-mathews.com/  This test is not yet approved or cleared by the [place] FDA and  has been authorized for detection and/or diagnosis of SARS-CoV-2 by FDA under an Emergency Use Authorization (EUA). This EUA will remain  in effect (meaning this test can be used) for the duration of the COVID-19 declaration under Se ction 564(b)(1) of the Act, 21 U.S.C. section 360bbb-3(b)(1), unless the authorization is terminated or revoked sooner.  Performed at [facility], [place] [address] [place], [place] [address]   Blood Culture (routine x 2)     Status: None (  Preliminary result)   Collection Time: 05/27/20  7:55 PM   Specimen: BLOOD  Result Value Ref Range Status   Specimen Description BLOOD BLOOD RIGHT FOREARM  Final   Special Requests   Final    Blood Culture adequate volume BOTTLES DRAWN AEROBIC AND ANAEROBIC   Culture   Final    NO GROWTH 2 DAYS Performed at [facility], [address], [place], [place] [address]    Report Status PENDING  Incomplete  Blood Culture (routine x 2)     Status: None (Preliminary result)   Collection Time: 05/27/20  8:00 PM   Specimen: BLOOD  Result Value Ref Range Status   Specimen Description BLOOD BLOOD LEFT ARM  Final   Special Requests   Final    Blood Culture adequate volume BOTTLES DRAWN AEROBIC AND ANAEROBIC   Culture   Final    NO GROWTH 2 DAYS Performed at [facility], [address], [place], [place] [address]    Report Status PENDING   Incomplete  Resp Panel by RT-PCR (Flu A&B, Covid) Nasopharyngeal Swab     Status: None   Collection Time: 05/27/20  8:26 PM   Specimen: Nasopharyngeal Swab; Nasopharyngeal(NP) swabs in vial transport medium  Result Value Ref Range Status   SARS Coronavirus 2 by RT PCR NEGATIVE NEGATIVE Final    Comment: (NOTE) SARS-CoV-2 target nucleic acids are NOT DETECTED.  The SARS-CoV-2 RNA is generally detectable in upper respiratory specimens during the acute phase of infection. The lowest concentration of SARS-CoV-2 viral copies this assay can detect is 138 copies/mL. A negative result does not preclude SARS-Cov-2 infection and should not be used as the sole basis for treatment or other patient management decisions. A negative result may occur with  improper specimen collection/handling, submission of specimen other than nasopharyngeal swab, presence of viral mutation(s) within the areas targeted by this assay, and inadequate number of viral copies(<138 copies/mL). A negative result must be combined with clinical observations, patient history, and epidemiological information. The expected result is Negative.  Fact Sheet for Patients:  EntrepreneurPulse.com.au  Fact Sheet for Healthcare Providers:  IncredibleEmployment.be  This test is no t yet approved or cleared by the [place] FDA and  has been authorized for detection and/or diagnosis of SARS-CoV-2 by FDA under an Emergency Use Authorization (EUA). This EUA will remain  in effect (meaning this test can be used) for the duration of the COVID-19 declaration under Section 564(b)(1) of the Act, 21 U.S.C.section 360bbb-3(b)(1), unless the authorization is terminated  or revoked sooner.       Influenza A by PCR NEGATIVE NEGATIVE Final   Influenza B by PCR NEGATIVE NEGATIVE Final    Comment: (NOTE) The Xpert Xpress SARS-CoV-2/FLU/RSV plus assay is intended as an aid in the diagnosis of influenza  from Nasopharyngeal swab specimens and should not be used as a sole basis for treatment. Nasal washings and aspirates are unacceptable for Xpert Xpress SARS-CoV-2/FLU/RSV testing.  Fact Sheet for Patients: EntrepreneurPulse.com.au  Fact Sheet for Healthcare Providers: IncredibleEmployment.be  This test is not yet approved or cleared by the [place] FDA and has been authorized for detection and/or diagnosis of SARS-CoV-2 by FDA under an Emergency Use Authorization (EUA). This EUA will remain in effect (meaning this test can be used) for the duration of the COVID-19 declaration under Section 564(b)(1) of the Act, 21 U.S.C. section 360bbb-3(b)(1), unless the authorization is terminated or revoked.  Performed at [facility], [address], [place], [place] [address]  MRSA PCR Screening     Status: None   Collection Time: 05/28/20  9:01 AM   Specimen: Nasopharyngeal  Result Value Ref Range Status   MRSA by PCR NEGATIVE NEGATIVE Final    Comment:        The GeneXpert MRSA Assay (FDA approved for NASAL specimens only), is one component of a comprehensive MRSA colonization surveillance program. It is not intended to diagnose MRSA infection nor to guide or monitor treatment for MRSA infections. Performed at [facility], [address] [place], [place] [address]     Radiology Studies: CT Abdomen Pelvis Wo Contrast  Result Date: 05/27/2020 CLINICAL DATA:  Abdominal pain and fever EXAM: CT ABDOMEN AND PELVIS WITHOUT CONTRAST TECHNIQUE: Multidetector CT imaging of the abdomen and pelvis was performed following the standard protocol without IV contrast. COMPARISON:  CT February 17, 2008 FINDINGS: Lower chest: Bibasilar atelectasis/scarring. Normal size heart. No significant pericardial effusion/thickening. Small hiatal hernia. Hepatobiliary: Unremarkable noncontrast appearance of the hepatic parenchyma. Gallbladder is surgically absent. No biliary  ductal dilation. Pancreas: Mild retroperitoneal/peripancreatic fat stranding. No pancreatic ductal dilation. Spleen: Within normal limits Adrenals/Urinary Tract: Mild apparent thickening of the the bilateral adrenal glands with adjacent inflammatory stranding. No hydronephrosis. No nephrolithiasis. No contour deforming renal masses. The urinary bladder is grossly unremarkable for degree of distension. Stomach/Bowel: Small hiatal hernia otherwise the stomach is grossly unremarkable for degree of distension. No pathologic dilation of small bowel. Tiny appendicoliths in a non appendix. No suspicious colonic wall thickening or mass like lesions. Scattered colonic diverticulosis without findings of acute diverticulitis. Vascular/Lymphatic: Aortic atherosclerosis. No enlarged abdominal or pelvic lymph nodes. Reproductive: Uterus and bilateral adnexa are unremarkable. Other: No pneumoperitoneum. Musculoskeletal: Multilevel degenerative changes spine. No acute osseous abnormality. IMPRESSION: 1. Mild retroperitoneal/peripancreatic fat stranding nonspecific but possibly representing acute pancreatitis. Correlation with serum lipase is recommended. 2. Mild apparent thickening of the bilateral adrenal glands with adjacent inflammatory stranding, which is nonspecific but may represent adrenal congestion. 3. Colonic diverticulosis without findings of acute diverticulitis. 4. Small hiatal hernia. 5. Aortic atherosclerosis. Aortic Atherosclerosis (ICD10-I70.0). Electronically Signed   By: Dahlia Bailiff MD   On: 05/27/2020 22:01   CT Angio Head W or Wo Contrast  Result Date: 05/28/2020 CLINICAL DATA:  Initial evaluation for acute headache. EXAM: CT ANGIOGRAPHY HEAD AND NECK CT VENOGRAM OF THE HEAD WITH CONTRAST. TECHNIQUE: Multidetector CT imaging of the head and neck was performed using the standard protocol during bolus administration of intravenous contrast. Multiplanar CT image reconstructions and MIPs were obtained to  evaluate the vascular anatomy. Carotid stenosis measurements (when applicable) are obtained utilizing NASCET criteria, using the distal internal carotid diameter as the denominator. CONTRAST:  36mL OMNIPAQUE IOHEXOL 350 MG/ML SOLN COMPARISON:  Prior noncontrast CT from 05/27/2020. FINDINGS: CTA NECK FINDINGS Aortic arch: Visualized aortic arch normal caliber with normal branch pattern. Mild atheromatous change about the arch and origin of the great vessels without hemodynamically significant stenosis. Right carotid system: Right common and internal carotid arteries widely patent without stenosis, dissection or occlusion. Minimal atheromatous plaque about the right bifurcation without stenosis. Left carotid system: Left CCA patent from its origin to the bifurcation without stenosis. 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 Left ICA patent distally without stenosis, dissection or occlusion. Vertebral arteries: Both vertebral arteries arise from the subclavian arteries. No proximal subclavian artery stenosis. Vertebral arteries patent within the neck without stenosis, dissection or occlusion. Skeleton: No visible acute osseous  finding. No discrete or worrisome osseous lesions. Other neck: No other acute soft tissue abnormality within the neck. No mass or adenopathy. Upper chest: Advanced emphysema. Superimposed scattered patchy opacities within the posterior left greater than right lungs, could reflect atelectasis or infiltrates. Review of the MIP images confirms the above findings CTA HEAD FINDINGS Anterior circulation: Petrous segments widely patent bilaterally. Minimal atheromatous change within the carotid siphons without significant stenosis or other abnormality. A1 segments widely patent. Normal anterior communicating artery complex. Anterior cerebral arteries patent to their distal aspects without stenosis. No M1 stenosis or occlusion. Right M1  bifurcates early. Bifurcations M cells within normal limits. Distal MCA branches well perfused and symmetric. Posterior circulation: Both V4 segments patent to the vertebrobasilar junction without stenosis. Both PICA origins patent and normal. Basilar patent to its distal aspect without stenosis. Superior cerebellar arteries patent bilaterally. Both PCAs primarily supplied via the basilar well perfused or distal aspects. Small right posterior communicating artery noted. Venous sinuses: Normal enhancement seen throughout the superior sagittal sinus to the torcula. Torcula itself appears patent. Transverse and sigmoid sinuses are patent as are the visualized proximal internal jugular veins. Straight sinus, vein of Galen, internal cerebral veins, and basal veins of Rosenthal appear patent. No evidence for dural sinus thrombosis. Cavernous sinus appears patent. Superior orbital veins symmetric and within normal limits. Anatomic variants: None significant. No aneurysm or other vascular abnormality. Review of the MIP images confirms the above findings IMPRESSION: 1. Negative CTA for large vessel occlusion. No high-grade or correctable stenosis within the major arterial vasculature of the head and neck. No aneurysm. 2. 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 3. Negative CT venogram.  No evidence for dural sinus thrombosis. 4. Emphysema (ICD10-J43.9). Superimposed patchy opacities within the posterior left greater than right lungs could reflect atelectasis or infiltrates. Correlation with physical exam and any potential symptomatology recommended. Electronically Signed   By: Jeannine Boga M.D.   On: 05/28/2020 04:07   CT HEAD WO CONTRAST  Result Date: 05/27/2020 CLINICAL DATA:  Headache.  Dysarthria EXAM: CT HEAD WITHOUT CONTRAST TECHNIQUE: Contiguous axial images were obtained from the base of the skull through the vertex without intravenous  contrast. COMPARISON:  None. FINDINGS: Brain: Ventricles and sulci are normal in size and configuration. There is no intracranial mass, hemorrhage, extra-axial fluid collection, or midline shift. Brain parenchyma appears unremarkable. No evident acute infarct. Vascular: No hyperdense vessel.  No evident vascular calcification. Skull: Bony calvarium appears intact. Sinuses/Orbits: There are small retention cysts in each inferior maxillary antrum. There is a small retention cyst in a posterior right ethmoid air cell. There is rightward deviation of the nasal septum. Orbits appear symmetric bilaterally. Other: Mastoid air cells are clear. There is debris in the right external auditory canal. IMPRESSION: Normal appearing brain parenchyma. No acute infarct. No mass or hemorrhage. Foci of mild paranasal sinus disease. Deviated nasal septum. Probable cerumen in the right external auditory canal. Electronically Signed   By: Lowella Grip III M.D.   On: 05/27/2020 20:00   CT Angio Neck W and/or Wo Contrast  Result Date: 05/28/2020 CLINICAL DATA:  Initial evaluation for acute headache. EXAM: CT ANGIOGRAPHY HEAD AND NECK CT VENOGRAM OF THE HEAD WITH CONTRAST. TECHNIQUE: Multidetector CT imaging of the head and neck was performed using the standard protocol during bolus administration of intravenous contrast. Multiplanar CT image reconstructions and MIPs were obtained to evaluate the vascular anatomy. Carotid stenosis measurements (  when applicable) are obtained utilizing NASCET criteria, using the distal internal carotid diameter as the denominator. CONTRAST:  61mL OMNIPAQUE IOHEXOL 350 MG/ML SOLN COMPARISON:  Prior noncontrast CT from 05/27/2020. FINDINGS: CTA NECK FINDINGS Aortic arch: Visualized aortic arch normal caliber with normal branch pattern. Mild atheromatous change about the arch and origin of the great vessels without hemodynamically significant stenosis. Right carotid system: Right common and internal  carotid arteries widely patent without stenosis, dissection or occlusion. Minimal atheromatous plaque about the right bifurcation without stenosis. Left carotid system: Left CCA patent from its origin to the bifurcation without stenosis. 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 Left ICA patent distally without stenosis, dissection or occlusion. Vertebral arteries: Both vertebral arteries arise from the subclavian arteries. No proximal subclavian artery stenosis. Vertebral arteries patent within the neck without stenosis, dissection or occlusion. Skeleton: No visible acute osseous finding. No discrete or worrisome osseous lesions. Other neck: No other acute soft tissue abnormality within the neck. No mass or adenopathy. Upper chest: Advanced emphysema. Superimposed scattered patchy opacities within the posterior left greater than right lungs, could reflect atelectasis or infiltrates. Review of the MIP images confirms the above findings CTA HEAD FINDINGS Anterior circulation: Petrous segments widely patent bilaterally. Minimal atheromatous change within the carotid siphons without significant stenosis or other abnormality. A1 segments widely patent. Normal anterior communicating artery complex. Anterior cerebral arteries patent to their distal aspects without stenosis. No M1 stenosis or occlusion. Right M1 bifurcates early. Bifurcations M cells within normal limits. Distal MCA branches well perfused and symmetric. Posterior circulation: Both V4 segments patent to the vertebrobasilar junction without stenosis. Both PICA origins patent and normal. Basilar patent to its distal aspect without stenosis. Superior cerebellar arteries patent bilaterally. Both PCAs primarily supplied via the basilar well perfused or distal aspects. Small right posterior communicating artery noted. Venous sinuses: Normal enhancement seen throughout the superior sagittal sinus  to the torcula. Torcula itself appears patent. Transverse and sigmoid sinuses are patent as are the visualized proximal internal jugular veins. Straight sinus, vein of Galen, internal cerebral veins, and basal veins of Rosenthal appear patent. No evidence for dural sinus thrombosis. Cavernous sinus appears patent. Superior orbital veins symmetric and within normal limits. Anatomic variants: None significant. No aneurysm or other vascular abnormality. Review of the MIP images confirms the above findings IMPRESSION: 1. Negative CTA for large vessel occlusion. No high-grade or correctable stenosis within the major arterial vasculature of the head and neck. No aneurysm. 2. 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 3. Negative CT venogram.  No evidence for dural sinus thrombosis. 4. Emphysema (ICD10-J43.9). Superimposed patchy opacities within the posterior left greater than right lungs could reflect atelectasis or infiltrates. Correlation with physical exam and any potential symptomatology recommended. Electronically Signed   By: Rise Mu M.D.   On: 05/28/2020 04:07   DG Chest Port 1 View  Result Date: 05/27/2020 CLINICAL DATA:  Questionable sepsis - evaluate for abnormality EXAM: PORTABLE CHEST 1 VIEW COMPARISON:  Radiograph 01/28/2006 FINDINGS: The cardiomediastinal contours are normal. Chronic blunting of left costophrenic angle. Mild interstitial coarsening. Pulmonary vasculature is normal. No consolidation, pleural effusion, or pneumothorax. No acute osseous abnormalities are seen. IMPRESSION: 1. Mild interstitial coarsening of unknown acuity, likely chronic. 2. Chronic blunting of the left costophrenic angle. Electronically Signed   By: Narda Rutherford M.D.   On: 05/27/2020 20:41  CT VENOGRAM HEAD  Result Date: 05/28/2020 CLINICAL DATA:  Initial evaluation for acute headache. EXAM: CT ANGIOGRAPHY HEAD AND NECK CT VENOGRAM OF  THE HEAD WITH CONTRAST. TECHNIQUE: Multidetector CT imaging of the head and neck was performed using the standard protocol during bolus administration of intravenous contrast. Multiplanar CT image reconstructions and MIPs were obtained to evaluate the vascular anatomy. Carotid stenosis measurements (when applicable) are obtained utilizing NASCET criteria, using the distal internal carotid diameter as the denominator. CONTRAST:  41mL OMNIPAQUE IOHEXOL 350 MG/ML SOLN COMPARISON:  Prior noncontrast CT from 05/27/2020. FINDINGS: CTA NECK FINDINGS Aortic arch: Visualized aortic arch normal caliber with normal branch pattern. Mild atheromatous change about the arch and origin of the great vessels without hemodynamically significant stenosis. Right carotid system: Right common and internal carotid arteries widely patent without stenosis, dissection or occlusion. Minimal atheromatous plaque about the right bifurcation without stenosis. Left carotid system: Left CCA patent from its origin to the bifurcation without stenosis. 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 Left ICA patent distally without stenosis, dissection or occlusion. Vertebral arteries: Both vertebral arteries arise from the subclavian arteries. No proximal subclavian artery stenosis. Vertebral arteries patent within the neck without stenosis, dissection or occlusion. Skeleton: No visible acute osseous finding. No discrete or worrisome osseous lesions. Other neck: No other acute soft tissue abnormality within the neck. No mass or adenopathy. Upper chest: Advanced emphysema. Superimposed scattered patchy opacities within the posterior left greater than right lungs, could reflect atelectasis or infiltrates. Review of the MIP images confirms the above findings CTA HEAD FINDINGS Anterior circulation: Petrous segments widely patent bilaterally. Minimal atheromatous change within the carotid siphons  without significant stenosis or other abnormality. A1 segments widely patent. Normal anterior communicating artery complex. Anterior cerebral arteries patent to their distal aspects without stenosis. No M1 stenosis or occlusion. Right M1 bifurcates early. Bifurcations M cells within normal limits. Distal MCA branches well perfused and symmetric. Posterior circulation: Both V4 segments patent to the vertebrobasilar junction without stenosis. Both PICA origins patent and normal. Basilar patent to its distal aspect without stenosis. Superior cerebellar arteries patent bilaterally. Both PCAs primarily supplied via the basilar well perfused or distal aspects. Small right posterior communicating artery noted. Venous sinuses: Normal enhancement seen throughout the superior sagittal sinus to the torcula. Torcula itself appears patent. Transverse and sigmoid sinuses are patent as are the visualized proximal internal jugular veins. Straight sinus, vein of Galen, internal cerebral veins, and basal veins of Rosenthal appear patent. No evidence for dural sinus thrombosis. Cavernous sinus appears patent. Superior orbital veins symmetric and within normal limits. Anatomic variants: None significant. No aneurysm or other vascular abnormality. Review of the MIP images confirms the above findings IMPRESSION: 1. Negative CTA for large vessel occlusion. No high-grade or correctable stenosis within the major arterial vasculature of the head and neck. No aneurysm. 2. 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 3. Negative CT venogram.  No evidence for dural sinus thrombosis. 4. Emphysema (ICD10-J43.9). Superimposed patchy opacities within the posterior left greater than right lungs could reflect atelectasis or infiltrates. Correlation with physical exam and any potential symptomatology recommended. Electronically Signed   By: Jeannine Boga M.D.   On: 05/28/2020 04:07    DG FLUORO GUIDE LUMBAR PUNCTURE  Result Date: 05/29/2020 CLINICAL DATA:  Headache EXAM: DIAGNOSTIC LUMBAR PUNCTURE UNDER FLUOROSCOPIC GUIDANCE COMPARISON:  Head CT 05/27/2020 FLUOROSCOPY  TIME:  Fluoroscopy Time:  20 seconds Radiation Exposure Index (if provided by the fluoroscopic device): 1.1 mGy Number of Acquired Spot Images: 0 PROCEDURE: Informed consent was obtained from the patient prior to the procedure, including potential complications of headache, allergy, and pain. With the patient prone, the lower back was prepped with Betadine. 1% Lidocaine was used for local anesthesia. Lumbar puncture was performed at the L2-3 level using a 20 gauge gauge needle with return of clear CSF with an opening pressure of 35 cm water. Twelve ml of CSF were obtained for laboratory studies. The patient tolerated the procedure well and there were no apparent complications. IMPRESSION: Technically successful lumbar puncture under fluoroscopic guidance as above. Electronically Signed   By: Rolm Baptise M.D.   On: 05/29/2020 16:14   Scheduled Meds: . aspirin EC  81 mg Oral Daily  . atorvastatin  80 mg Oral Daily  . doxycycline  100 mg Oral Q12H  . fenofibrate  160 mg Oral Daily  . lisinopril  10 mg Oral Daily  . loratadine  10 mg Oral Daily  . pantoprazole  40 mg Oral Daily   Continuous Infusions: . acyclovir 580 mg (05/29/20 1641)  . lactated ringers 50 mL/hr at 05/29/20 1557     LOS: 1 day    Time spent: 35 minutes   Barton Dubois, MD [facility]   To contact the attending provider between 7A-7P or the covering provider during after hours 7P-7A, please log into the web site [email or web address] and access using universal [place] password for that web site. If you do not have the password, please call the hospital operator.  05/29/2020, 4:52 PM

## 2020-05-29 NOTE — Consult Note (Signed)
Jenner A. Merlene Laughter, MD     [email or web address]          Angela Mullins is an 64 y.o. female.   ASSESSMENT/PLAN: 1.  Subacute/acute headache syndrome with CSF analysis consistent with meningitis.  The patient has been started on broad-spectrum antibiotics.  Given that he is history of insect bite and the lack of systemic symptoms I suspect that rickettsial infection or Lyme are almost most likely.  Other concerns includes cryptococcal meningitis and herpes simplex infection.  The patient has been started on broad-spectrum antibiotics and antiviral agents.  Doxycycline will also be initiated.  As it is unlikely that the patient has bacterial meningitis, steroids is not recommended.   However, the patient has been started on ceftriaxone and vancomycin. TB meningitis is also unlikely.  I will attempt to do additional analysis of the spinal fluid for cryptococcal antigen and herpes.  Rickettsial antibodies will also be obtained and also antibodies for Lyme disease.    This is a 65 year old white female who presents with a 3-day history of holocephalic headaches which started Thursday.  She indicated she does have a history of migraine headaches although she tells me these are infrequent and has not had a migraine headache recently.  She has had little bit of nausea.  She does not report having a fever no fevers recorded in the hospital.  She was bit by an insect a couple weeks ago and had 2 small red spots which after few days enlarge significantly with extensive reddening of the skin.  She is not certain exactly what type of insect it was.  She reports no traveling outside of the [place] and no other rashes are reported.  She does complain of some neck pain.  The review of systems otherwise unrevealing.    GENERAL: She appears to be uncomfortable  HEENT: Mild neck stiffness; no trauma noted  ABDOMEN: soft  EXTREMITIES: No edema; no clear Kernig signs  BACK:  Normal  SKIN: Normal by inspection.    MENTAL STATUS: Alert and oriented. Speech, language and cognition are generally intact. Judgment and insight normal.   CRANIAL NERVES: Pupils are equal, round and reactive to light and accomodation; extra ocular movements are full, there is no significant nystagmus; visual fields are full; upper and lower facial muscles are normal in strength and symmetric, there is no flattening of the nasolabial folds; tongue is midline; uvula is midline; shoulder elevation is normal.  MOTOR: Normal tone, bulk and strength; no pronator drift.  COORDINATION: Left finger to nose is normal, right finger to nose is normal, No rest tremor; no intention tremor; no postural tremor; no bradykinesia.  REFLEXES: Deep tendon reflexes are symmetrical and normal.   SENSATION: Normal to light touch, temperature, and pain.       Blood pressure (!) 166/90, pulse 72, temperature 98.2 F (36.8 C), temperature source Oral, resp. rate 18, height 5' 3.75" (1.619 m), weight 58.1 kg, SpO2 100 %.  Past Medical History:  Diagnosis Date  . Anxiety   . Aortic atherosclerosis ([place]) 05/28/2020  . Emphysema/COPD ([place]) 05/28/2020  . GERD (gastroesophageal reflux disease)   . Hypercholesteremia   . Hypertension   . Vitamin D deficiency     Past Surgical History:  Procedure Laterality Date  . BIOPSY  05/23/2020   Procedure: BIOPSY;  Surgeon: Harvel Quale, MD;  [facility];  Service: Gastroenterology;;  gastric esophageal  . CHOLECYSTECTOMY    . COLONOSCOPY WITH PROPOFOL N/A 05/23/2020  Procedure: COLONOSCOPY WITH PROPOFOL;  Surgeon: Harvel Quale, MD;  [facility];  Service: Gastroenterology;  Laterality: N/A;  10:45 AM  . ESOPHAGEAL DILATION N/A 05/23/2020   Procedure: ESOPHAGEAL DILATION;  Surgeon: Harvel Quale, MD;  [facility];  Service: Gastroenterology;  Laterality: N/A;  . ESOPHAGOGASTRODUODENOSCOPY (EGD)  WITH PROPOFOL N/A 05/23/2020   Procedure: ESOPHAGOGASTRODUODENOSCOPY (EGD) WITH PROPOFOL;  Surgeon: Harvel Quale, MD;  [facility];  Service: Gastroenterology;  Laterality: N/A;  . PELVIC FRACTURE SURGERY    . POLYPECTOMY  05/23/2020   Procedure: POLYPECTOMY;  Surgeon: Harvel Quale, MD;  [facility];  Service: Gastroenterology;;  colon  . WISDOM TOOTH EXTRACTION      Family History  Problem Relation Age of Onset  . Alzheimer's disease Mother   . Heart disease Father   . Diabetes Sister   . Heart disease Sister   . Hypertension Brother     Social History:  reports that she has quit smoking. She has never used smokeless tobacco. She reports current alcohol use. She reports that she does not use drugs.  Allergies:  Allergies  Allergen Reactions  . Demerol [Meperidine Hcl] Other (See Comments)    Does not help  . Quinine Derivatives Nausea And Vomiting    Medications: Prior to Admission medications   Medication Sig Start Date End Date Taking? Authorizing Provider  aspirin EC 81 MG tablet Take 81 mg by mouth daily. Swallow whole.   Yes [provider]  atorvastatin (LIPITOR) 80 MG tablet Take 80 mg by mouth daily.   Yes [provider]  cetirizine (ZYRTEC) 10 MG tablet Take 10 mg by mouth daily.   Yes [provider]  clonazePAM (KLONOPIN) 0.5 MG tablet Take 0.5 mg by mouth 2 (two) times daily.   Yes [provider]  diphenhydrAMINE (BENADRYL) 25 mg capsule Take 25 mg by mouth every 8 (eight) hours as needed for itching (Swelling).   Yes [provider]  eletriptan (RELPAX) 40 MG tablet Take 40 mg by mouth every 2 (two) hours as needed for migraine. 05/26/20  Yes [provider]  ergocalciferol (VITAMIN D2) 1.25 MG (50000 UT) capsule Take 50,000 Units by mouth once a week.   Yes [provider]  fenofibrate micronized (LOFIBRA) 200 MG capsule Take 200 mg by mouth daily before  breakfast.   Yes [provider]  Halcinonide (HALOG) 0.1 % OINT Apply 1 application topically daily as needed (on bite).   Yes [provider]  ibuprofen (ADVIL) 200 MG tablet Take 400 mg by mouth daily as needed for headache.   Yes [provider]  lisinopril (ZESTRIL) 10 MG tablet Take 10 mg by mouth daily.   Yes [provider]  Multiple Vitamins-Minerals (MULTIVITAMIN WITH MINERALS) tablet Take 1 tablet by mouth daily. 50 +   Yes [provider]  omeprazole (PRILOSEC) 40 MG capsule Take 40 mg by mouth 2 (two) times daily.   Yes [provider]  Potassium 99 MG TABS Take by mouth daily. Patient not taking: Reported on 05/27/2020    [provider]    Scheduled Meds: . aspirin EC  81 mg Oral Daily  . atorvastatin  80 mg Oral Daily  . fenofibrate  160 mg Oral Daily  . lisinopril  10 mg Oral Daily  . loratadine  10 mg Oral Daily  . pantoprazole  40 mg Oral Daily   Continuous Infusions: . sodium chloride    .  acyclovir 580 mg (05/29/20 1641)  . cefTRIAXone (ROCEPHIN)  IV    . lactated ringers 50 mL/hr at 05/29/20 1557  . vancomycin     Followed by  . [START ON 05/30/2020] vancomycin     PRN Meds:.acetaminophen **OR** acetaminophen, clonazePAM, diphenhydrAMINE, eletriptan, ketorolac, ondansetron **OR** ondansetron (ZOFRAN) IV     Results for orders placed or performed during the hospital encounter of 05/27/20 (from the past 48 hour(s))  Protime-INR     Status: None   Collection Time: 05/27/20  7:21 PM  Result Value Ref Range   Prothrombin Time 14.0 11.4 - 15.2 seconds   INR 1.1 0.8 - 1.2    Comment: (NOTE) INR goal varies based on device and disease states. Performed at [facility], [address], [place], [place] [address]   APTT     Status: None   Collection Time: 05/27/20  7:21 PM  Result Value Ref Range   aPTT 31 24 - 36 seconds    Comment: Performed at [facility], [address], [place], [place]  [address]  CBC     Status: Abnormal   Collection Time: 05/27/20  7:21 PM  Result Value Ref Range   WBC 19.3 (H) 4.0 - 10.5 K/uL   RBC 5.10 3.87 - 5.11 MIL/uL   Hemoglobin 15.1 (H) 12.0 - 15.0 g/dL   HCT 45.7 36.0 - 46.0 %   MCV 89.6 80.0 - 100.0 fL   MCH 29.6 26.0 - 34.0 pg   MCHC 33.0 30.0 - 36.0 g/dL   RDW 12.7 11.5 - 15.5 %   Platelets 273 150 - 400 K/uL   nRBC 0.0 0.0 - 0.2 %    Comment: Performed at [facility], [address] [place], [place] [address]  Differential     Status: Abnormal   Collection Time: 05/27/20  7:21 PM  Result Value Ref Range   Neutrophils Relative % 90 %   Neutro Abs 17.4 (H) 1.7 - 7.7 K/uL   Lymphocytes Relative 4 %   Lymphs Abs 0.8 0.7 - 4.0 K/uL   Monocytes Relative 5 %   Monocytes Absolute 0.9 0.1 - 1.0 K/uL   Eosinophils Relative 0 %   Eosinophils Absolute 0.0 0.0 - 0.5 K/uL   Basophils Relative 0 %   Basophils Absolute 0.0 0.0 - 0.1 K/uL   Immature Granulocytes 1 %   Abs Immature Granulocytes 0.16 (H) 0.00 - 0.07 K/uL    Comment: Performed at [facility], [address] [place], [place] [address]  Comprehensive metabolic panel     Status: Abnormal   Collection Time: 05/27/20  7:21 PM  Result Value Ref Range   Sodium 140 135 - 145 mmol/L   Potassium 3.7 3.5 - 5.1 mmol/L   Chloride 106 98 - 111 mmol/L   CO2 22 22 - 32 mmol/L   Glucose, Bld 148 (H) 70 - 99 mg/dL    Comment: Glucose reference range applies only to samples taken after fasting for at least 8 hours.   BUN 16 8 - 23 mg/dL   Creatinine, Ser 0.88 0.44 - 1.00 mg/dL   Calcium 10.1 8.9 - 10.3 mg/dL   Total Protein 8.3 (H) 6.5 - 8.1 g/dL   Albumin 4.2 3.5 - 5.0 g/dL   AST 28 15 - 41 U/L   ALT 49 (H) 0 - 44 U/L   Alkaline Phosphatase 112 38 - 126 U/L   Total Bilirubin 0.9 0.3 - 1.2 mg/dL   GFR, Estimated >60 >60 mL/min  Comment: (NOTE) Calculated using the CKD-EPI Creatinine Equation (2021)    Anion gap 12 5 - 15    Comment: Performed at [facility], [address]  [place], [place] [address]  Lactic acid, plasma     Status: None   Collection Time: 05/27/20  7:21 PM  Result Value Ref Range   Lactic Acid, Venous 1.3 0.5 - 1.9 mmol/L    Comment: Performed at [facility], [address] [place], [place] [address]  Lipase, blood     Status: None   Collection Time: 05/27/20  7:21 PM  Result Value Ref Range   Lipase 19 11 - 51 U/L    Comment: Performed at [facility], [address] [place], [place] [address]  I-stat chem 8, ED     Status: Abnormal   Collection Time: 05/27/20  7:33 PM  Result Value Ref Range   Sodium 143 135 - 145 mmol/L   Potassium 3.8 3.5 - 5.1 mmol/L   Chloride 106 98 - 111 mmol/L   BUN 15 8 - 23 mg/dL   Creatinine, Ser 0.80 0.44 - 1.00 mg/dL   Glucose, Bld 148 (H) 70 - 99 mg/dL    Comment: Glucose reference range applies only to samples taken after fasting for at least 8 hours.   Calcium, Ion 1.34 1.15 - 1.40 mmol/L   TCO2 21 (L) 22 - 32 mmol/L   Hemoglobin 15.6 (H) 12.0 - 15.0 g/dL   HCT 46.0 36.0 - 46.0 %  CBG monitoring, ED     Status: Abnormal   Collection Time: 05/27/20  7:34 PM  Result Value Ref Range   Glucose-Capillary 136 (H) 70 - 99 mg/dL    Comment: Glucose reference range applies only to samples taken after fasting for at least 8 hours.  Blood Culture (routine x 2)     Status: None (Preliminary result)   Collection Time: 05/27/20  7:55 PM   Specimen: BLOOD  Result Value Ref Range   Specimen Description BLOOD BLOOD RIGHT FOREARM    Special Requests      Blood Culture adequate volume BOTTLES DRAWN AEROBIC AND ANAEROBIC   Culture      NO GROWTH 2 DAYS Performed at [facility], [address], [place], [place] [address]    Report Status PENDING   Blood Culture (routine x 2)     Status: None (Preliminary result)   Collection Time: 05/27/20  8:00 PM   Specimen: BLOOD  Result Value Ref Range   Specimen Description BLOOD BLOOD LEFT ARM    Special Requests      Blood Culture adequate volume BOTTLES DRAWN AEROBIC AND  ANAEROBIC   Culture      NO GROWTH 2 DAYS Performed at [facility], [address] [place], [place] [address]    Report Status PENDING   Resp Panel by RT-PCR (Flu A&B, Covid) Nasopharyngeal Swab     Status: None   Collection Time: 05/27/20  8:26 PM   Specimen: Nasopharyngeal Swab; Nasopharyngeal(NP) swabs in vial transport medium  Result Value Ref Range   SARS Coronavirus 2 by RT PCR NEGATIVE NEGATIVE    Comment: (NOTE) SARS-CoV-2 target nucleic acids are NOT DETECTED.  The SARS-CoV-2 RNA is generally detectable in upper respiratory specimens during the acute phase of infection. The lowest concentration of SARS-CoV-2 viral copies this assay can detect is 138 copies/mL. A negative result does not preclude SARS-Cov-2 infection and should not be used as the sole basis for treatment or other patient management  decisions. A negative result may occur with  improper specimen collection/handling, submission of specimen other than nasopharyngeal swab, presence of viral mutation(s) within the areas targeted by this assay, and inadequate number of viral copies(<138 copies/mL). A negative result must be combined with clinical observations, patient history, and epidemiological information. The expected result is Negative.  Fact Sheet for Patients:  EntrepreneurPulse.com.au  Fact Sheet for Healthcare Providers:  IncredibleEmployment.be  This test is no t yet approved or cleared by the [place] FDA and  has been authorized for detection and/or diagnosis of SARS-CoV-2 by FDA under an Emergency Use Authorization (EUA). This EUA will remain  in effect (meaning this test can be used) for the duration of the COVID-19 declaration under Section 564(b)(1) of the Act, 21 U.S.C.section 360bbb-3(b)(1), unless the authorization is terminated  or revoked sooner.       Influenza A by PCR NEGATIVE NEGATIVE   Influenza B by PCR NEGATIVE NEGATIVE    Comment:  (NOTE) The Xpert Xpress SARS-CoV-2/FLU/RSV plus assay is intended as an aid in the diagnosis of influenza from Nasopharyngeal swab specimens and should not be used as a sole basis for treatment. Nasal washings and aspirates are unacceptable for Xpert Xpress SARS-CoV-2/FLU/RSV testing.  Fact Sheet for Patients: EntrepreneurPulse.com.au  Fact Sheet for Healthcare Providers: IncredibleEmployment.be  This test is not yet approved or cleared by the [place] FDA and has been authorized for detection and/or diagnosis of SARS-CoV-2 by FDA under an Emergency Use Authorization (EUA). This EUA will remain in effect (meaning this test can be used) for the duration of the COVID-19 declaration under Section 564(b)(1) of the Act, 21 U.S.C. section 360bbb-3(b)(1), unless the authorization is terminated or revoked.  Performed at [facility], [address] [place], [place] [address]   Lactic acid, plasma     Status: None   Collection Time: 05/27/20 10:39 PM  Result Value Ref Range   Lactic Acid, Venous 0.8 0.5 - 1.9 mmol/L    Comment: Performed at [facility], [address] [place], [place] [address]  Procalcitonin     Status: None   Collection Time: 05/27/20 10:39 PM  Result Value Ref Range   Procalcitonin 2.71 ng/mL    Comment:        Interpretation: PCT > 2 ng/mL: Systemic infection (sepsis) is likely, unless other causes are known. (NOTE)       Sepsis PCT Algorithm           Lower Respiratory Tract                                      Infection PCT Algorithm    ----------------------------     ----------------------------         PCT < 0.25 ng/mL                PCT < 0.10 ng/mL          Strongly encourage             Strongly discourage   discontinuation of antibiotics    initiation of antibiotics    ----------------------------     -----------------------------       PCT 0.25 - 0.50 ng/mL            PCT 0.10 - 0.25 ng/mL               OR        >80%  decrease in PCT            Discourage initiation of                                            antibiotics      Encourage discontinuation           of antibiotics    ----------------------------     -----------------------------         PCT >= 0.50 ng/mL              PCT 0.26 - 0.50 ng/mL               AND       <80% decrease in PCT              Encourage initiation of                                             antibiotics       Encourage continuation           of antibiotics    ----------------------------     -----------------------------        PCT >= 0.50 ng/mL                  PCT > 0.50 ng/mL               AND         increase in PCT                  Strongly encourage                                      initiation of antibiotics    Strongly encourage escalation           of antibiotics                                     -----------------------------                                           PCT <= 0.25 ng/mL                                                 OR                                        > 80% decrease in PCT                                      Discontinue / Do not initiate  antibiotics  Performed at [facility], [address] [place], [place] [address]   CBC with Differential     Status: Abnormal   Collection Time: 05/28/20  4:38 AM  Result Value Ref Range   WBC 14.8 (H) 4.0 - 10.5 K/uL   RBC 4.04 3.87 - 5.11 MIL/uL   Hemoglobin 11.9 (L) 12.0 - 15.0 g/dL   HCT 37.2 36.0 - 46.0 %   MCV 92.1 80.0 - 100.0 fL   MCH 29.5 26.0 - 34.0 pg   MCHC 32.0 30.0 - 36.0 g/dL   RDW 13.1 11.5 - 15.5 %   Platelets 228 150 - 400 K/uL   nRBC 0.0 0.0 - 0.2 %   Neutrophils Relative % 84 %   Neutro Abs 12.6 (H) 1.7 - 7.7 K/uL   Lymphocytes Relative 7 %   Lymphs Abs 1.0 0.7 - 4.0 K/uL   Monocytes Relative 8 %   Monocytes Absolute 1.1 (H) 0.1 - 1.0 K/uL   Eosinophils Relative 0 %   Eosinophils Absolute 0.0 0.0 - 0.5  K/uL   Basophils Relative 0 %   Basophils Absolute 0.0 0.0 - 0.1 K/uL   Immature Granulocytes 1 %   Abs Immature Granulocytes 0.11 (H) 0.00 - 0.07 K/uL    Comment: Performed at [facility], [address], [place], [place] [address]  Comprehensive metabolic panel     Status: Abnormal   Collection Time: 05/28/20  4:38 AM  Result Value Ref Range   Sodium 139 135 - 145 mmol/L   Potassium 3.8 3.5 - 5.1 mmol/L   Chloride 105 98 - 111 mmol/L   CO2 26 22 - 32 mmol/L   Glucose, Bld 127 (H) 70 - 99 mg/dL    Comment: Glucose reference range applies only to samples taken after fasting for at least 8 hours.   BUN 12 8 - 23 mg/dL   Creatinine, Ser 0.66 0.44 - 1.00 mg/dL   Calcium 8.8 (L) 8.9 - 10.3 mg/dL   Total Protein 5.8 (L) 6.5 - 8.1 g/dL   Albumin 2.8 (L) 3.5 - 5.0 g/dL   AST 45 (H) 15 - 41 U/L   ALT 66 (H) 0 - 44 U/L   Alkaline Phosphatase 151 (H) 38 - 126 U/L   Total Bilirubin 0.7 0.3 - 1.2 mg/dL   GFR, Estimated >60 >60 mL/min    Comment: (NOTE) Calculated using the CKD-EPI Creatinine Equation (2021)    Anion gap 8 5 - 15    Comment: Performed at [facility], [address] [place], [place] [address]  MRSA PCR Screening     Status: None   Collection Time: 05/28/20  9:01 AM   Specimen: Nasopharyngeal  Result Value Ref Range   MRSA by PCR NEGATIVE NEGATIVE    Comment:        The GeneXpert MRSA Assay (FDA approved for NASAL specimens only), is one component of a comprehensive MRSA colonization surveillance program. It is not intended to diagnose MRSA infection nor to guide or monitor treatment for MRSA infections. Performed at [facility], [address], [place], [place] [address]   Urinalysis, Routine w reflex microscopic In/Out Cath Urine     Status: Abnormal   Collection Time: 05/28/20 12:09 PM  Result Value Ref Range   Color, Urine YELLOW YELLOW   APPearance CLEAR CLEAR   Specific Gravity, Urine 1.025 1.005 - 1.030   pH 5.0 5.0 - 8.0   Glucose, UA 50 (A) NEGATIVE  mg/dL   Hgb urine  dipstick NEGATIVE NEGATIVE   Bilirubin Urine NEGATIVE NEGATIVE   Ketones, ur 5 (A) NEGATIVE mg/dL   Protein, ur NEGATIVE NEGATIVE mg/dL   Nitrite NEGATIVE NEGATIVE   Leukocytes,Ua NEGATIVE NEGATIVE    Comment: Performed at [facility], [address] [place], [place] [address]  HIV Antibody (routine testing w rflx)     Status: None   Collection Time: 05/29/20  4:59 AM  Result Value Ref Range   HIV Screen 4th Generation wRfx Non Reactive Non Reactive    Comment: Performed at [facility], [place] [address], [place], [place] [address]  Protein and glucose, CSF     Status: Abnormal   Collection Time: 05/29/20  3:30 PM  Result Value Ref Range   Glucose, CSF 29 (LL) 40 - 70 mg/dL    Comment: CRITICAL RESULT CALLED TO, READ BACK BY AND VERIFIED WITH: COLEMAN.L ON 05/29/20 AT 1655 BY LOY,C    Total  Protein, CSF 97 (H) 15 - 45 mg/dL    Comment: Performed at [facility], [address], [place], [place] [address]    Studies/Results: HEAD CT  FINDINGS: Brain: Ventricles and sulci are normal in size and configuration. There is no intracranial mass, hemorrhage, extra-axial fluid collection, or midline shift. Brain parenchyma appears unremarkable. No evident acute infarct.  Vascular: No hyperdense vessel.  No evident vascular calcification.  Skull: Bony calvarium appears intact.  Sinuses/Orbits: There are small retention cysts in each inferior maxillary antrum. There is a small retention cyst in a posterior right ethmoid air cell. There is rightward deviation of the nasal septum. Orbits appear symmetric bilaterally.  Other: Mastoid air cells are clear. There is debris in the right external auditory canal.  IMPRESSION: Normal appearing brain parenchyma. No acute infarct. No mass or hemorrhage.  Foci of mild paranasal sinus disease. Deviated nasal septum. Probable cerumen in the right external auditory canal    HEAD NECK CTA IMPRESSION: 1. Negative  CTA for large vessel occlusion. No high-grade or correctable stenosis within the major arterial vasculature of the head and neck. No aneurysm. 2. 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 3. Negative CT venogram.  No evidence for dural sinus thrombosis. 4. Emphysema (ICD10-J43.9). Superimposed patchy opacities within the posterior left greater than right lungs could reflect atelectasis or infiltrates. Correlation with physical exam and any potential symptomatology recommended    CSF -- WBC 318 RBC 69 segs 45% which is elevated; glucose low at 29 and protein 97   Tashawn Greff A. Merlene Laughter, M.D.  Diplomate, Tax adviser of Psychiatry and Neurology ( Neurology). 05/29/2020, 5:29 PM

## 2020-05-29 NOTE — Progress Notes (Signed)
Lab called patients gram stain showed Mono and [place] , notified Dr. Dyann Kief

## 2020-05-29 NOTE — Progress Notes (Signed)
Date and time results received: 05/29/20 1731 (use smartphrase ".now" to insert current time)  Test: CSF WBC  Critical Value: CSF WBC 318  Name of Provider Notified: Dr.Madera  Orders Received? Or Actions Taken?: Awaiting new orders

## 2020-05-29 NOTE — Progress Notes (Signed)
Pharmacy Antibiotic Note  Angela Mullins is a 65 y.o. female admitted on 05/27/2020 with sepsis.  Pharmacy has been consulted for cefepime and acyclovir dosing.  Plan: Acyclovir 10mg /kg (580mg ) IV q8h F/U cxs and clinical progress Monitor V/S, labs  Height: 5' 3.75" (161.9 cm) Weight: 58.1 kg (128 lb) IBW/kg (Calculated) : 54.13  Temp (24hrs), Avg:98 F (36.7 C), Min:97.6 F (36.4 C), Max:98.9 F (37.2 C)  Recent Labs  Lab 05/27/20 1921 05/27/20 1933 05/27/20 2239 05/28/20 0438  WBC 19.3*  --   --  14.8*  CREATININE 0.88 0.80  --  0.66  LATICACIDVEN 1.3  --  0.8  --     Estimated Creatinine Clearance: 61.5 mL/min (by C-G formula based on SCr of 0.66 mg/dL).    Allergies  Allergen Reactions  . Demerol [Meperidine Hcl] Other (See Comments)    Does not help  . Quinine Derivatives Nausea And Vomiting    Antimicrobials this admission: 5/14 cefepime >> 5/16 5/14 vancomycin >> 5/15 5/14 metronidazole >>5/16 Doxycycline 5/16>> Acyclovir 5/16>>  Microbiology results: 5/14 BCx: ngtd 5/15 UCx: sent  5/14 Resp Panel: negative 5/15 MRSA PCR is negative Thank you for allowing pharmacy to be a part of this patient's care.  Isac Sarna, BS [place], [place] Clinical Pharmacist Pager [phone] 05/29/2020 2:55 PM

## 2020-05-30 LAB — URINE CULTURE: Culture: NO GROWTH

## 2020-05-30 LAB — COMPREHENSIVE METABOLIC PANEL
ALT: 49 U/L — ABNORMAL HIGH (ref 0–44)
AST: 26 U/L (ref 15–41)
Albumin: 2.4 g/dL — ABNORMAL LOW (ref 3.5–5.0)
Alkaline Phosphatase: 121 U/L (ref 38–126)
Anion gap: 8 (ref 5–15)
BUN: 19 mg/dL (ref 8–23)
CO2: 26 mmol/L (ref 22–32)
Calcium: 8.6 mg/dL — ABNORMAL LOW (ref 8.9–10.3)
Chloride: 107 mmol/L (ref 98–111)
Creatinine, Ser: 0.58 mg/dL (ref 0.44–1.00)
GFR, Estimated: >60 mL/min (ref >60)
Glucose, Bld: 94 mg/dL (ref 70–99)
Potassium: 3.3 mmol/L — ABNORMAL LOW (ref 3.5–5.1)
Sodium: 141 mmol/L (ref 135–145)
Total Bilirubin: 0.5 mg/dL (ref 0.3–1.2)
Total Protein: 5.4 g/dL — ABNORMAL LOW (ref 6.5–8.1)

## 2020-05-30 LAB — CBC
HCT: 35.8 % — ABNORMAL LOW (ref 36.0–46.0)
Hemoglobin: 12 g/dL (ref 12.0–15.0)
MCH: 29.8 pg (ref 26.0–34.0)
MCHC: 33.5 g/dL (ref 30.0–36.0)
MCV: 88.8 fL (ref 80.0–100.0)
Platelets: 224 10*3/uL (ref 150–400)
RBC: 4.03 MIL/uL (ref 3.87–5.11)
RDW: 12.4 % (ref 11.5–15.5)
WBC: 6.3 10*3/uL (ref 4.0–10.5)
nRBC: 0 % (ref 0.0–0.2)

## 2020-05-30 LAB — HSV DNA BY PCR (REFERENCE LAB)

## 2020-05-30 LAB — PATHOLOGIST SMEAR REVIEW

## 2020-05-30 LAB — RPR: RPR Ser Ql: NONREACTIVE

## 2020-05-30 MED ORDER — POTASSIUM CHLORIDE CRYS ER 20 MEQ PO TBCR
40.0000 meq | EXTENDED_RELEASE_TABLET | Freq: Once | ORAL | Status: AC
  Administered 2020-05-30: 40 meq via ORAL
  Filled 2020-05-30: qty 2

## 2020-05-30 MED ORDER — DEXAMETHASONE SODIUM PHOSPHATE 10 MG/ML IJ SOLN
10.0000 mg | Freq: Four times a day (QID) | INTRAMUSCULAR | Status: DC
  Administered 2020-05-30 – 2020-05-31 (×5): 10 mg via INTRAVENOUS
  Filled 2020-05-30 (×5): qty 1

## 2020-05-30 NOTE — Progress Notes (Signed)
PROGRESS NOTE    Angela Mullins  [id number] DOB: 04-27-1955 DOA: 05/27/2020 PCP: Coolidge Breeze, FNP   Chief Complaint  Patient presents with  . severe headache    Incomprehensible speech/ dysphagia    Brief Narrative:  As per H&P written by Dr. Olevia Bowens on 05/28/20 Angela Mullins is a 65 y.o. female with medical history significant of anxiety, GERD, hyperlipidemia, hypertension, vitamin D deficiency who is coming to the emergency department due to headache, similar to her migraines, but more intense than usual that has not responded to Relpax for the past 48 hours associated with nausea, vomiting and generalized weakness.  Her husband also stated in the ED that her mentation has been decreased. She had a colonoscopy on Tuesday with some polypectomies, but denies abdominal pain, diarrhea, constipation, melena hematochezia..  Wednesday was uneventful, but then developed symptoms of Thursday.  EMS was called, but subsequently they decided not to come to the ED.   No history of fever at home, but was febrile at 101 F in the emergency department.  No sore throat, cough, dyspnea, wheezing or hemoptysis.  No chest pain, palpitations, lower extremity edema.  No dysuria, frequency or hematuria.  ED Course: Initial vital signs were temperature 98.5 F, pulse 99, respirations 17, BP 170/93 mmHg O2 sat 98% on room air.  The patient received acetaminophen 650 mg p.o., 2000 mL of LR bolus, Zofran 4 mg IVP, hydromorphone 1 mg IVP x2, cefepime, metronidazole and vancomycin.  Assessment & Plan: 1-Acute headache and photophobia -also complaining of neck pain -LP suggesting meningitis (bacterial versus viral) -Continue empiric acyclovir, rocephin and vancomycin ordered -Patient also started on dexamethasone per protocol. -will provide ivf's and supportive care -Follow CSF final cultures and results  2-hx of migraine -dihydroergotamine X 1 given on 05/29/2020 with some improvement of her symptoms; would  recommend to continue using as needed -continue also the use tylenol and PRN toradol -Steroids initiated as per problem number 1, will assist with patient's migraine management.  3-GERD -continue PPI  4-HLD -continue lipitor  5-HTN -continue lisinopril  6-hyperglycemia -will follow CBG's -no prior hx of diabetes   7-hx of emphysema -no wheezing on exam -continue PRN bronchodilators and oxygen supplementation.  8-dehydration and hypokalemia -IVF's given -will maintain adequate hydration and replete electrolytes as needed.  9-insect bite -patient reported insect bite about 1.5 week ago -will continue use oral doxycycline empirically.  DVT prophylaxis: -lovenox Code Status: Full Code Family Communication: no family at bedside. Disposition:   Status is: Inpatient   Dispo: The patient is from:home              Anticipated d/c is to: home              Patient currently not medically stable for discharge. Continue supportive care and IV antibiotics for suspected meningitis.    Difficult to place patient no    Consultants:   Neurology service.    Procedures:  See below for x-ray reprots -LP 05/29/20   Antimicrobials:  MRSA PCR negative Work up and LP suggesting meningitis: patient on rocephin, vancomycin and acyclovir.   Subjective: No nausea or vomiting; oriented x3 reporting improvement in her headache (while still having discomfort).  Objective: Vitals:   05/29/20 1516 05/29/20 2033 05/30/20 0607 05/30/20 1343  BP: (!) 166/90 (!) 145/81 (!) 153/71 (!) 123/54  Pulse: 72 70 70 65  Resp: 18 16 20 20   Temp: 98.2 F (36.8 C) 97.8 F (36.6 C) 98.4 F (36.9 C) 97.6  F (36.4 C)  TempSrc: Oral  Oral Oral  SpO2: 100% 99% 98% 96%  Weight:      Height:        Intake/Output Summary (Last 24 hours) at 05/30/2020 1526 Last data filed at 05/30/2020 0900 Gross per 24 hour  Intake 788.7 ml  Output 350 ml  Net 438.7 ml   Filed Weights   05/27/20 1923   Weight: 58.1 kg    Examination: General exam: Alert, awake, oriented x 3; patient reports (while still having some discomfort); no fevers, no nausea or vomiting. Respiratory system: Clear to auscultation. Respiratory effort normal. Cardiovascular system:RRR. No murmurs, rubs, gallops.  No JVD Gastrointestinal system: Abdomen is nondistended, soft and nontender. No organomegaly or masses felt. Normal bowel sounds heard. Central nervous system: Alert and oriented. No focal neurological deficits. Extremities: No cyanosis, clubbing or edema. Skin: No petechiae Psychiatry: Judgement and insight appear normal. Mood & affect appropriate.    Data Reviewed: I have personally reviewed following labs and imaging studies  CBC: Recent Labs  Lab 05/27/20 1921 05/27/20 1933 05/28/20 0438 05/30/20 0524  WBC 19.3*  --  14.8* 6.3  NEUTROABS 17.4*  --  12.6*  --   HGB 15.1* 15.6* 11.9* 12.0  HCT 45.7 46.0 37.2 35.8*  MCV 89.6  --  92.1 88.8  PLT 273  --  228 [id number]    Basic Metabolic Panel: Recent Labs  Lab 05/27/20 1921 05/27/20 1933 05/28/20 0438 05/30/20 0524  NA 140 143 139 141  K 3.7 3.8 3.8 3.3*  CL 106 106 105 107  CO2 22  --  26 26  GLUCOSE 148* 148* 127* 94  BUN 16 15 12 19   CREATININE 0.88 0.80 0.66 0.58  CALCIUM 10.1  --  8.8* 8.6*    GFR: Estimated Creatinine Clearance: 61.5 mL/min (by C-G formula based on SCr of 0.58 mg/dL).  Liver Function Tests: Recent Labs  Lab 05/27/20 1921 05/28/20 0438 05/30/20 0524  AST 28 45* 26  ALT 49* 66* 49*  ALKPHOS 112 151* 121  BILITOT 0.9 0.7 0.5  PROT 8.3* 5.8* 5.4*  ALBUMIN 4.2 2.8* 2.4*    CBG: Recent Labs  Lab 05/27/20 1934  GLUCAP 136*     Recent Results (from the past 240 hour(s))  SARS CORONAVIRUS 2 (TAT 6-24 HRS) Nasopharyngeal Nasopharyngeal Swab     Status: None   Collection Time: 05/22/20  9:52 AM   Specimen: Nasopharyngeal Swab  Result Value Ref Range Status   SARS Coronavirus 2 NEGATIVE NEGATIVE Final     Comment: (NOTE) SARS-CoV-2 target nucleic acids are NOT DETECTED.  The SARS-CoV-2 RNA is generally detectable in upper and lower respiratory specimens during the acute phase of infection. Negative results do not preclude SARS-CoV-2 infection, do not rule out co-infections with other pathogens, and should not be used as the sole basis for treatment or other patient management decisions. Negative results must be combined with clinical observations, patient history, and epidemiological information. The expected result is Negative.  Fact Sheet for Patients: SugarRoll.be  Fact Sheet for Healthcare Providers: https://www.woods-mathews.com/  This test is not yet approved or cleared by the [place] FDA and  has been authorized for detection and/or diagnosis of SARS-CoV-2 by FDA under an Emergency Use Authorization (EUA). This EUA will remain  in effect (meaning this test can be used) for the duration of the COVID-19 declaration under Se ction 564(b)(1) of the Act, 21 U.S.C. section 360bbb-3(b)(1), unless the authorization is terminated or revoked sooner.  Performed at [facility], [place] [address] [place], [place] [address]   Blood Culture (routine x 2)     Status: None (Preliminary result)   Collection Time: 05/27/20  7:55 PM   Specimen: BLOOD  Result Value Ref Range Status   Specimen Description BLOOD BLOOD RIGHT FOREARM  Final   Special Requests   Final    Blood Culture adequate volume BOTTLES DRAWN AEROBIC AND ANAEROBIC   Culture   Final    NO GROWTH 3 DAYS Performed at [facility], [address] [place], [place] [address]    Report Status PENDING  Incomplete  Blood Culture (routine x 2)     Status: None (Preliminary result)   Collection Time: 05/27/20  8:00 PM   Specimen: BLOOD  Result Value Ref Range Status   Specimen Description BLOOD BLOOD LEFT ARM  Final   Special Requests   Final    Blood Culture adequate volume  BOTTLES DRAWN AEROBIC AND ANAEROBIC   Culture   Final    NO GROWTH 3 DAYS Performed at [facility], [address], [place], [place] [address]    Report Status PENDING  Incomplete  Resp Panel by RT-PCR (Flu A&B, Covid) Nasopharyngeal Swab     Status: None   Collection Time: 05/27/20  8:26 PM   Specimen: Nasopharyngeal Swab; Nasopharyngeal(NP) swabs in vial transport medium  Result Value Ref Range Status   SARS Coronavirus 2 by RT PCR NEGATIVE NEGATIVE Final    Comment: (NOTE) SARS-CoV-2 target nucleic acids are NOT DETECTED.  The SARS-CoV-2 RNA is generally detectable in upper respiratory specimens during the acute phase of infection. The lowest concentration of SARS-CoV-2 viral copies this assay can detect is 138 copies/mL. A negative result does not preclude SARS-Cov-2 infection and should not be used as the sole basis for treatment or other patient management decisions. A negative result may occur with  improper specimen collection/handling, submission of specimen other than nasopharyngeal swab, presence of viral mutation(s) within the areas targeted by this assay, and inadequate number of viral copies(<138 copies/mL). A negative result must be combined with clinical observations, patient history, and epidemiological information. The expected result is Negative.  Fact Sheet for Patients:  EntrepreneurPulse.com.au  Fact Sheet for Healthcare Providers:  IncredibleEmployment.be  This test is no t yet approved or cleared by the [place] FDA and  has been authorized for detection and/or diagnosis of SARS-CoV-2 by FDA under an Emergency Use Authorization (EUA). This EUA will remain  in effect (meaning this test can be used) for the duration of the COVID-19 declaration under Section 564(b)(1) of the Act, 21 U.S.C.section 360bbb-3(b)(1), unless the authorization is terminated  or revoked sooner.       Influenza A by PCR NEGATIVE NEGATIVE  Final   Influenza B by PCR NEGATIVE NEGATIVE Final    Comment: (NOTE) The Xpert Xpress SARS-CoV-2/FLU/RSV plus assay is intended as an aid in the diagnosis of influenza from Nasopharyngeal swab specimens and should not be used as a sole basis for treatment. Nasal washings and aspirates are unacceptable for Xpert Xpress SARS-CoV-2/FLU/RSV testing.  Fact Sheet for Patients: EntrepreneurPulse.com.au  Fact Sheet for Healthcare Providers: IncredibleEmployment.be  This test is not yet approved or cleared by the [place] FDA and has been authorized for detection and/or diagnosis of SARS-CoV-2 by FDA under an Emergency Use Authorization (EUA). This EUA will remain in effect (meaning this test can be used) for the duration of the COVID-19 declaration under Section 564(b)(1) of the  Act, 21 U.S.C. section 360bbb-3(b)(1), unless the authorization is terminated or revoked.  Performed at [facility], [address], [place], [place] [address]   MRSA PCR Screening     Status: None   Collection Time: 05/28/20  9:01 AM   Specimen: Nasopharyngeal  Result Value Ref Range Status   MRSA by PCR NEGATIVE NEGATIVE Final    Comment:        The GeneXpert MRSA Assay (FDA approved for NASAL specimens only), is one component of a comprehensive MRSA colonization surveillance program. It is not intended to diagnose MRSA infection nor to guide or monitor treatment for MRSA infections. Performed at [facility], [address], [place], [place] [address]   Urine culture     Status: None   Collection Time: 05/28/20 12:09 PM   Specimen: Urine, Catheterized  Result Value Ref Range Status   Specimen Description   Final    URINE, CATHETERIZED Performed at [facility], [address], [place], [place] [address]    Special Requests   Final    NONE Performed at [facility], [address], [place], [place] [address]    Culture   Final    NO GROWTH Performed at  [facility], [place] [address] [place], [place] [address]    Report Status 05/30/2020 FINAL  Final  CSF culture w Stat Gram Stain     Status: None (Preliminary result)   Collection Time: 05/29/20  3:30 PM   Specimen: CSF; Cerebrospinal Fluid  Result Value Ref Range Status   Specimen Description   Final    CSF Performed at [facility], [address] [place], [place] [address]    Special Requests   Final    NONE Performed at [facility], [address], [place], [place] [address]    Gram Stain   Final    NO ORGANISMS SEEN WBC PRESENT,BOTH PMN AND MONONUCLEAR Gram Stain Report Called to,Read Back By and Verified With: COLEMAN L. AT 9518 ON [id number] BY THOMPSON S. Performed at [facility], [address], [place], [place] [address]    Culture   Final    NO GROWTH < 24 HOURS Performed at [place] [address], [place], [place] [address]    Report Status PENDING  Incomplete    Radiology Studies: DG FLUORO GUIDE LUMBAR PUNCTURE  Result Date: 05/29/2020 CLINICAL DATA:  Headache EXAM: DIAGNOSTIC LUMBAR PUNCTURE UNDER FLUOROSCOPIC GUIDANCE COMPARISON:  Head CT 05/27/2020 FLUOROSCOPY TIME:  Fluoroscopy Time:  20 seconds Radiation Exposure Index (if provided by the fluoroscopic device): 1.1 mGy Number of Acquired Spot Images: 0 PROCEDURE: Informed consent was obtained from the patient prior to the procedure, including potential complications of headache, allergy, and pain. With the patient prone, the lower back was prepped with Betadine. 1% Lidocaine was used for local anesthesia. Lumbar puncture was performed at the L2-3 level using a 20 gauge gauge needle with return of clear CSF with an opening pressure of 35 cm water. Twelve ml of CSF were obtained for laboratory studies. The patient tolerated the procedure well and there were no apparent complications. IMPRESSION: Technically successful lumbar puncture under fluoroscopic guidance as above. Electronically Signed   By: Rolm Baptise M.D.   On: 05/29/2020 16:14   Scheduled Meds: . aspirin EC  81 mg Oral Daily  . atorvastatin  80 mg Oral Daily  . dexamethasone (DECADRON) injection  10 mg Intravenous Q6H  . doxycycline  100 mg Oral Q12H  .  fenofibrate  160 mg Oral Daily  . lisinopril  10 mg Oral Daily  . loratadine  10 mg Oral Daily  . pantoprazole  40 mg Oral Daily   Continuous Infusions: . sodium chloride 50 mL/hr at 05/29/20 1824  . acyclovir 580 mg (05/30/20 1509)  . cefTRIAXone (ROCEPHIN)  IV 2 g (05/30/20 0542)  . lactated ringers 50 mL/hr at 05/29/20 1557  . vancomycin       LOS: 2 days    Time spent: 35 minutes   Barton Dubois, MD [facility]   To contact the attending provider between 7A-7P or the covering provider during after hours 7P-7A, please log into the web site [email or web address] and access using universal [place] password for that web site. If you do not have the password, please call the hospital operator.  05/30/2020, 3:26 PM

## 2020-05-30 NOTE — Progress Notes (Addendum)
Akins A. Merlene Laughter, MD     [email or web address]          Angela Mullins is an 65 y.o. female.   ASSESSMENT/PLAN: 1.  Subacute/acute headache syndrome with CSF analysis consistent with meningitis.  The patient has been started on broad-spectrum antibiotics.  Given that he is history of insect bite and the lack of systemic symptoms I suspect that rickettsial infection or Lyme are almost most likely.  Other concerns includes cryptococcal meningitis and herpes simplex infection.  The patient has been started on broad-spectrum antibiotics and antiviral agents.  Doxycycline will also be initiated.  As it is unlikely that the patient has bacterial meningitis, steroids is not recommended.   However, the patient has been started on ceftriaxone and vancomycin. TB meningitis is also unlikely.  I will attempt to do additional analysis of the spinal fluid for cryptococcal antigen and herpes.  Rickettsial antibodies will also be obtained and also antibodies for Lyme disease.  CSF culture Gram stain and blood cultures so far negative.  Given the drowsiness and MRI will be obtained.    She reports mild improvement in headaches.  The husband reports that she underwent colonoscopy and endoscopy on Tuesday and on Thursday she developed a severe headache.  The headache started mildly but over 3 hours development very severe/worst headache.  This was associated with nausea and vomiting.  It appeared that she had a fever in the ED of 101 as indicated by the hospitalist.  The family reports that she seemed to have a waxing level of arousal with the patient being quite drowsy at times.  She apparently dose of [place] once.  Headaches are now shooting especially frontal region and she still has some neck pain although this has improved.    GENERAL: She appears to be uncomfortable  HEENT: Mild neck stiffness; no trauma noted  ABDOMEN: soft  EXTREMITIES: No edema; no clear Kernig signs  BACK:  Normal  SKIN: 2 areas of erythema about 5 cm in the abdominal region below the umbilicus.    MENTAL STATUS: Alert and oriented. Speech, language and cognition are generally intact. Judgment and insight normal.   CRANIAL NERVES: Pupils are equal, round and reactive to light and accomodation; extra ocular movements are full, there is no significant nystagmus; visual fields are full; upper and lower facial muscles are normal in strength and symmetric, there is no flattening of the nasolabial folds; tongue is midline; uvula is midline; shoulder elevation is normal.  MOTOR: Normal tone, bulk and strength; no pronator drift.  COORDINATION: Left finger to nose is normal, right finger to nose is normal, No rest tremor; no intention tremor; no postural tremor; no bradykinesia.  REFLEXES: Deep tendon reflexes are symmetrical and normal.   SENSATION: Normal to light touch, temperature, and pain.       Blood pressure (!) 123/54, pulse 65, temperature 97.6 F (36.4 C), temperature source Oral, resp. rate 20, height 5' 3.75" (1.619 m), weight 58.1 kg, SpO2 96 %.  Past Medical History:  Diagnosis Date  . Anxiety   . Aortic atherosclerosis ([place]) 05/28/2020  . Emphysema/COPD ([place]) 05/28/2020  . GERD (gastroesophageal reflux disease)   . Hypercholesteremia   . Hypertension   . Vitamin D deficiency     Past Surgical History:  Procedure Laterality Date  . BIOPSY  05/23/2020   Procedure: BIOPSY;  Surgeon: Harvel Quale, MD;  [facility];  Service: Gastroenterology;;  gastric esophageal  . CHOLECYSTECTOMY    .  COLONOSCOPY WITH PROPOFOL N/A 05/23/2020   Procedure: COLONOSCOPY WITH PROPOFOL;  Surgeon: Harvel Quale, MD;  [facility];  Service: Gastroenterology;  Laterality: N/A;  10:45 AM  . ESOPHAGEAL DILATION N/A 05/23/2020   Procedure: ESOPHAGEAL DILATION;  Surgeon: Harvel Quale, MD;  [facility];  Service: Gastroenterology;   Laterality: N/A;  . ESOPHAGOGASTRODUODENOSCOPY (EGD) WITH PROPOFOL N/A 05/23/2020   Procedure: ESOPHAGOGASTRODUODENOSCOPY (EGD) WITH PROPOFOL;  Surgeon: Harvel Quale, MD;  [facility];  Service: Gastroenterology;  Laterality: N/A;  . PELVIC FRACTURE SURGERY    . POLYPECTOMY  05/23/2020   Procedure: POLYPECTOMY;  Surgeon: Harvel Quale, MD;  [facility];  Service: Gastroenterology;;  colon  . WISDOM TOOTH EXTRACTION      Family History  Problem Relation Age of Onset  . Alzheimer's disease Mother   . Heart disease Father   . Diabetes Sister   . Heart disease Sister   . Hypertension Brother     Social History:  reports that she has quit smoking. She has never used smokeless tobacco. She reports current alcohol use. She reports that she does not use drugs.  Allergies:  Allergies  Allergen Reactions  . Demerol [Meperidine Hcl] Other (See Comments)    Does not help  . Quinine Derivatives Nausea And Vomiting    Medications: Prior to Admission medications   Medication Sig Start Date End Date Taking? Authorizing Provider  aspirin EC 81 MG tablet Take 81 mg by mouth daily. Swallow whole.   Yes [provider]  atorvastatin (LIPITOR) 80 MG tablet Take 80 mg by mouth daily.   Yes [provider]  cetirizine (ZYRTEC) 10 MG tablet Take 10 mg by mouth daily.   Yes [provider]  clonazePAM (KLONOPIN) 0.5 MG tablet Take 0.5 mg by mouth 2 (two) times daily.   Yes [provider]  diphenhydrAMINE (BENADRYL) 25 mg capsule Take 25 mg by mouth every 8 (eight) hours as needed for itching (Swelling).   Yes [provider]  eletriptan (RELPAX) 40 MG tablet Take 40 mg by mouth every 2 (two) hours as needed for migraine. 05/26/20  Yes [provider]  ergocalciferol (VITAMIN D2) 1.25 MG (50000 UT) capsule Take 50,000 Units by mouth once a week.   Yes [provider]  fenofibrate micronized  (LOFIBRA) 200 MG capsule Take 200 mg by mouth daily before breakfast.   Yes [provider]  Halcinonide (HALOG) 0.1 % OINT Apply 1 application topically daily as needed (on bite).   Yes [provider]  ibuprofen (ADVIL) 200 MG tablet Take 400 mg by mouth daily as needed for headache.   Yes [provider]  lisinopril (ZESTRIL) 10 MG tablet Take 10 mg by mouth daily.   Yes [provider]  Multiple Vitamins-Minerals (MULTIVITAMIN WITH MINERALS) tablet Take 1 tablet by mouth daily. 50 +   Yes [provider]  omeprazole (PRILOSEC) 40 MG capsule Take 40 mg by mouth 2 (two) times daily.   Yes [provider]  Potassium 99 MG TABS Take by mouth daily. Patient not taking: Reported on 05/27/2020    [provider]    Scheduled Meds: . aspirin EC  81 mg Oral Daily  . atorvastatin  80 mg Oral Daily  . dexamethasone (DECADRON) injection  10 mg Intravenous Q6H  . doxycycline  100 mg Oral Q12H  . fenofibrate  160 mg Oral Daily  . lisinopril  10 mg Oral Daily  .  loratadine  10 mg Oral Daily  . pantoprazole  40 mg Oral Daily   Continuous Infusions: . sodium chloride 50 mL/hr at 05/29/20 1824  . acyclovir 580 mg (05/30/20 1509)  . cefTRIAXone (ROCEPHIN)  IV 2 g (05/30/20 0542)  . lactated ringers 50 mL/hr at 05/29/20 1557  . vancomycin     PRN Meds:.acetaminophen **OR** acetaminophen, clonazePAM, diphenhydrAMINE, ondansetron **OR** ondansetron (ZOFRAN) IV, SUMAtriptan     Results for orders placed or performed during the hospital encounter of 05/27/20 (from the past 48 hour(s))  HIV Antibody (routine testing w rflx)     Status: None   Collection Time: 05/29/20  4:59 AM  Result Value Ref Range   HIV Screen 4th Generation wRfx Non Reactive Non Reactive    Comment: Performed at [facility], [place] [address], [place], [place] [address]  CSF cell count with differential     Status: Abnormal   Collection Time: 05/29/20  3:30 PM   Result Value Ref Range   Tube # 4    Color, CSF YELLOW (A) COLORLESS   Appearance, CSF HAZY (A) CLEAR   Supernatant XANTHOCHROMIC    RBC Count, CSF 69 (H) 0 /cu mm   WBC, CSF 318 (HH) 0 - 5 /cu mm    Comment: CRITICAL RESULT CALLED TO, READ BACK BY AND VERIFIED WITH: COLEMAN L. AT 1728 ON 05/29/20 BY THOMPSON S.    Segmented Neutrophils-CSF 45 (H) 0 - 6 %   Lymphs, CSF 44 40 - 80 %   Monocyte-Macrophage-Spinal Fluid 11 (L) 15 - 45 %   Eosinophils, CSF 0 0 - 1 %    Comment: Performed at [facility], [address], [place], [place] [address]  CSF culture w Stat Gram Stain     Status: None (Preliminary result)   Collection Time: 05/29/20  3:30 PM   Specimen: CSF; Cerebrospinal Fluid  Result Value Ref Range   Specimen Description      CSF Performed at [facility], [address], [place], [place] [address]    Special Requests      NONE Performed at [facility], [address], [place], [place] [address]    Gram Stain      NO ORGANISMS SEEN WBC PRESENT,BOTH PMN AND MONONUCLEAR Gram Stain Report Called to,Read Back By and Verified With: COLEMAN L. AT 7564 ON [id number] BY THOMPSON S. Performed at [facility], [address] [place], [place] [address]    Culture      NO GROWTH < 24 HOURS Performed at [facility] [address], [place], [place] [address]    Report Status PENDING   Protein and glucose, CSF     Status: Abnormal   Collection Time: 05/29/20  3:30 PM  Result Value Ref Range   Glucose, CSF 29 (LL) 40 - 70 mg/dL    Comment: CRITICAL RESULT CALLED TO, READ BACK BY AND VERIFIED WITH: COLEMAN.L ON 05/29/20 AT 1655 BY LOY,C    Total  Protein, CSF 97 (H) 15 - 45 mg/dL    Comment: Performed at [facility], [address], [place], [place] [address]  Pathologist smear review     Status: None   Collection Time: 05/29/20  3:30 PM  Result Value Ref Range   Path Review Reviewed By Violet Baldy, M.D.     Comment: 05/30/20 Acute inflammation. Performed at [facility], [place] [address], [place],  [address]   Cryptococcal antigen, CSF  Status: None   Collection Time: 05/29/20  5:48 PM  Result Value Ref Range   Crypto Ag NEGATIVE NEGATIVE   Cryptococcal Ag Titer NOT INDICATED NOT INDICATED    Comment: Performed at [facility], [place] [address], [place], [place] [address]  Herpes simplex virus (HSV), DNA by PCR Sterile Swab     Status: None   Collection Time: 05/29/20  5:48 PM  Result Value Ref Range   Source of Sample CSF     Comment: Performed at [facility], [address], [place], [place] [address]   HSV 1 DNA WRORD     Comment: (NOTE) Test not performed. The required specimen for the test ordered was not received.      Vinnie Level T. was notified 05/30/2020.    HSV 2 DNA NOT PERFORMED     Comment: (NOTE) Test not performed Performed At: [facility] [place], [place] [id number] Rush Farmer MD [id number]   RPR     Status: None   Collection Time: 05/29/20  6:02 PM  Result Value Ref Range   RPR Ser Ql NON REACTIVE NON REACTIVE    Comment: Performed at [facility], [address], [place], [place] [address]  Vitamin B12     Status: None   Collection Time: 05/29/20  6:02 PM  Result Value Ref Range   Vitamin B-12 244 180 - 914 pg/mL    Comment: (NOTE) This assay is not validated for testing neonatal or myeloproliferative syndrome specimens for Vitamin B12 levels. Performed at [facility], [address], [place], [place] [address]   Comprehensive metabolic panel     Status: Abnormal   Collection Time: 05/30/20  5:24 AM  Result Value Ref Range   Sodium 141 135 - 145 mmol/L   Potassium 3.3 (L) 3.5 - 5.1 mmol/L   Chloride 107 98 - 111 mmol/L   CO2 26 22 - 32 mmol/L   Glucose, Bld 94 70 - 99 mg/dL    Comment: Glucose reference range applies only to samples taken after fasting for at least 8 hours.   BUN 19 8 - 23 mg/dL   Creatinine, Ser 0.58 0.44 - 1.00 mg/dL   Calcium  8.6 (L) 8.9 - 10.3 mg/dL   Total Protein 5.4 (L) 6.5 - 8.1 g/dL   Albumin 2.4 (L) 3.5 - 5.0 g/dL   AST 26 15 - 41 U/L   ALT 49 (H) 0 - 44 U/L   Alkaline Phosphatase 121 38 - 126 U/L   Total Bilirubin 0.5 0.3 - 1.2 mg/dL   GFR, Estimated >60 >60 mL/min    Comment: (NOTE) Calculated using the CKD-EPI Creatinine Equation (2021)    Anion gap 8 5 - 15    Comment: Performed at [facility], [address] [place], [place] [address]  CBC     Status: Abnormal   Collection Time: 05/30/20  5:24 AM  Result Value Ref Range   WBC 6.3 4.0 - 10.5 K/uL   RBC 4.03 3.87 - 5.11 MIL/uL   Hemoglobin 12.0 12.0 - 15.0 g/dL   HCT 35.8 (L) 36.0 - 46.0 %   MCV 88.8 80.0 - 100.0 fL   MCH 29.8 26.0 - 34.0 pg   MCHC 33.5 30.0 - 36.0 g/dL   RDW 12.4 11.5 - 15.5 %   Platelets 224 150 - 400 K/uL   nRBC 0.0 0.0 - 0.2 %    Comment: Performed at [facility], [address] [place],  [place] [address]    Studies/Results: HEAD CT  FINDINGS: Brain: Ventricles and sulci are normal in size and configuration. There is no intracranial mass, hemorrhage, extra-axial fluid collection, or midline shift. Brain parenchyma appears unremarkable. No evident acute infarct.  Vascular: No hyperdense vessel.  No evident vascular calcification.  Skull: Bony calvarium appears intact.  Sinuses/Orbits: There are small retention cysts in each inferior maxillary antrum. There is a small retention cyst in a posterior right ethmoid air cell. There is rightward deviation of the nasal septum. Orbits appear symmetric bilaterally.  Other: Mastoid air cells are clear. There is debris in the right external auditory canal.  IMPRESSION: Normal appearing brain parenchyma. No acute infarct. No mass or hemorrhage.  Foci of mild paranasal sinus disease. Deviated nasal septum. Probable cerumen in the right external auditory canal    HEAD NECK CTA IMPRESSION: 1. Negative CTA for large vessel occlusion. No high-grade  or correctable stenosis within the major arterial vasculature of the head and neck. No aneurysm. 2. 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 3. Negative CT venogram.  No evidence for dural sinus thrombosis. 4. Emphysema (ICD10-J43.9). Superimposed patchy opacities within the posterior left greater than right lungs could reflect atelectasis or infiltrates. Correlation with physical exam and any potential symptomatology recommended    CSF -- WBC 318 RBC 69 segs 45% which is elevated; glucose low at 29 and protein 97   Lillyann Ahart A. Merlene Laughter, M.D.  Diplomate, Tax adviser of Psychiatry and Neurology ( Neurology). 05/30/2020, 5:52 PM

## 2020-05-30 NOTE — Plan of Care (Signed)

## 2020-05-31 ENCOUNTER — Inpatient Hospital Stay (HOSPITAL_COMMUNITY)

## 2020-05-31 DIAGNOSIS — G030 Nonpyogenic meningitis: Principal | ICD-10-CM

## 2020-05-31 DIAGNOSIS — J441 Chronic obstructive pulmonary disease with (acute) exacerbation: Secondary | ICD-10-CM

## 2020-05-31 DIAGNOSIS — G44209 Tension-type headache, unspecified, not intractable: Secondary | ICD-10-CM

## 2020-05-31 DIAGNOSIS — J9601 Acute respiratory failure with hypoxia: Secondary | ICD-10-CM

## 2020-05-31 LAB — BASIC METABOLIC PANEL
Anion gap: 9 (ref 5–15)
BUN: 13 mg/dL (ref 8–23)
CO2: 26 mmol/L (ref 22–32)
Calcium: 9 mg/dL (ref 8.9–10.3)
Chloride: 107 mmol/L (ref 98–111)
Creatinine, Ser: 0.53 mg/dL (ref 0.44–1.00)
GFR, Estimated: >60 mL/min (ref >60)
Glucose, Bld: 147 mg/dL — ABNORMAL HIGH (ref 70–99)
Potassium: 3.8 mmol/L (ref 3.5–5.1)
Sodium: 142 mmol/L (ref 135–145)

## 2020-05-31 LAB — HSV 1/2 PCR, CSF
HSV-1 DNA: NEGATIVE
HSV-2 DNA: NEGATIVE

## 2020-05-31 LAB — HEMOGLOBIN A1C
Hgb A1c MFr Bld: 5.7 % — ABNORMAL HIGH (ref 4.8–5.6)
Mean Plasma Glucose: 116.89 mg/dL

## 2020-05-31 MED ORDER — GADOBUTROL 1 MMOL/ML IV SOLN
7.0000 mL | Freq: Once | INTRAVENOUS | Status: AC | PRN
  Administered 2020-05-31: 7 mL via INTRAVENOUS

## 2020-05-31 NOTE — Plan of Care (Signed)
  Problem: Acute Rehab PT Goals(only PT should resolve) Goal: Pt Will Go Supine/Side To Sit Outcome: Progressing Flowsheets (Taken 05/31/2020 1641) Pt will go Supine/Side to Sit:  Independently  with modified independence Goal: Patient Will Transfer Sit To/From Stand Outcome: Progressing Flowsheets (Taken 05/31/2020 1641) Patient will transfer sit to/from stand: with modified independence Goal: Pt Will Transfer Bed To Chair/Chair To Bed Outcome: Progressing Flowsheets (Taken 05/31/2020 1641) Pt will Transfer Bed to Chair/Chair to Bed: with modified independence Goal: Pt Will Ambulate Outcome: Progressing Flowsheets (Taken 05/31/2020 1641) Pt will Ambulate:  > 125 feet  with modified independence  with supervision  with rolling walker  with cane  with least restrictive assistive device   4:42 PM, 05/31/20 Lonell Grandchild, MPT Physical Therapist with [facility] 336 [phone] office [phone] mobile phone

## 2020-05-31 NOTE — Progress Notes (Signed)
PROGRESS NOTE  Angela Mullins [id number] DOB: 10/11/1955 DOA: 05/27/2020 PCP: Angela Breeze, Angela Mullins  Brief History:  65 y.o.femalewith medical history significant ofanxiety, GERD, hyperlipidemia, hypertension, vitamin D deficiency who is coming to the emergency department due to headache, similar to her migraines, but more intense than usual that has not responded to Angela Mullins for the past 48 hours associated with nausea, vomiting and generalized weakness. Her husband also stated in the ED that her mentation has been decreased. She had a colonoscopy on Tuesday with some polypectomies, but denies abdominal pain, diarrhea, constipation, melena hematochezia.. Wednesday was uneventful, but then developed symptoms of Thursday. Angela Mullins was called, but subsequently they decided not to come to the ED. No history of fever Angela Mullins home, but was febrile Angela Mullins 89 F in the emergency department. No sore throat, cough, dyspnea, wheezing or hemoptysis. No chest pain, palpitations, lower extremity edema. No dysuria, frequency or hematuria.  ED Course:Initial vital signs were temperature98.5 F, pulse 99, respirations 17, BP 170/93 mmHg O2 sat 98% on room air. The patient received acetaminophen 650 mg p.o., 2000 mL of Angela Mullins bolus, Angela Mullins 4 mg IVP, hydromorphone 1 mg IVP x2, cefepime, metronidazole and vancomycin  Assessment/Plan: Aseptic Meningitis -cannot completely r/o septic meningitis as patient had about 2 days abx prior to LP -CSF cultures remain neg -cryptococcus antigen neg -I have called lab today to resend CSF for Angela Mullins -continue empiric vanco and ceftriaxone -continue acyclovir -d/c dexamethasone -05/29/20 LP showed WBC 318, 69 RBC, gluc 29, pro 97 -await Angela Mullins and Angela Mullins serology -add Angela Mullins serology  Acute respiratory failure with hypoxia -initially placed on 4L>>2L -due to COPD exacerbation -start Angela Mullins -start Angela Mullins  Hyperlipidemia -continue statin and  fenofibrate  HTN -continue lisinopril  GERD -continue PPI  Hyperglycemia -check A1C  Insect bite -pt endorses insect bite about 2 weeks prior to admit -await serologies  Hypokalemia -repleted      Status is: Inpatient  Remains inpatient appropriate because:IV treatments appropriate due to intensity of illness or inability to take PO   Dispo: The patient is from: Home              Anticipated d/c is to: Home              Patient currently is not medically stable to d/c.   Difficult to place patient No        Family Communication: no  Family Angela Mullins bedside  Consultants:  neurology  Code Status:  FULL   Angela Mullins Prophylaxis:  Angela Mullins   Procedures: As Listed in Progress Note Above  Antibiotics: Acyclovir 5/16>> Ceftriaxone 5/16>> Cefepime 5/14>>5/16 vanc 5/14>>     Subjective: Patient states she still has headache, but it is improving.  N/v are better.  Denies f/c, cp, sob, n/v/d, abd pain, focal extremity weakness  Objective: Vitals:   05/30/20 1343 05/31/20 0419 05/31/20 1052 05/31/20 1252  BP: (!) 123/54 115/67 138/77 (!) 159/98  Pulse: 65 (!) 57 73 84  Resp: 20 19  19   Temp: 97.6 F (36.4 C) 98 F (36.7 C)  98.3 F (36.8 C)  TempSrc: Angela Mullins   Angela Mullins  SpO2: 96% 100% 100% 99%  Weight:      Height:        Intake/Output Summary (Last 24 hours) Angela Mullins 05/31/2020 1254 Last data filed Angela Mullins 05/31/2020 0500 Gross per 24 hour  Intake 1261.2 ml  Output 400 ml  Net 861.2 ml   Weight change:  Exam:  General:  Pt is alert, follows commands appropriately, not in acute distress  HEENT: No icterus, No thrush, No neck mass, [place]/Angela Mullins  Cardiovascular: Angela Mullins, Angela Mullins, no rubs, no gallops  Respiratory: bibasilar rales.  Bibasilar exp wheeze  Abdomen: Soft/+BS, non tender, non distended, no guarding  Extremities: No edema, No lymphangitis, No petechiae, No rashes, no synovitis   Data Reviewed: I have personally reviewed following labs and imaging studies Basic  Metabolic Panel: Recent Labs  Lab 05/27/20 1921 05/27/20 1933 05/28/20 0438 05/30/20 0524 05/31/20 0554  NA 140 143 139 141 142  K 3.7 3.8 3.8 3.3* 3.8  CL 106 106 105 107 107  CO2 22  --  26 26 26   GLUCOSE 148* 148* 127* 94 147*  BUN 16 15 12 19 13   CREATININE 0.88 0.80 0.66 0.58 0.53  CALCIUM 10.1  --  8.8* 8.6* 9.0   Liver Function Tests: Recent Labs  Lab 05/27/20 1921 05/28/20 0438 05/30/20 0524  AST 28 45* 26  ALT 49* 66* 49*  ALKPHOS 112 151* 121  BILITOT 0.9 0.7 0.5  PROT 8.3* 5.8* 5.4*  ALBUMIN 4.2 2.8* 2.4*   Recent Labs  Lab 05/27/20 1921  LIPASE 19   No results for input(s): AMMONIA in the last 168 hours. Coagulation Profile: Recent Labs  Lab 05/27/20 1921  INR 1.1   CBC: Recent Labs  Lab 05/27/20 1921 05/27/20 1933 05/28/20 0438 05/30/20 0524  WBC 19.3*  --  14.8* 6.3  NEUTROABS 17.4*  --  12.6*  --   HGB 15.1* 15.6* 11.9* 12.0  HCT 45.7 46.0 37.2 35.8*  MCV 89.6  --  92.1 88.8  PLT 273  --  228 224   Cardiac Enzymes: No results for input(s): CKTOTAL, CKMB, CKMBINDEX, TROPONINI in the last 168 hours. BNP: Invalid input(s): POCBNP CBG: Recent Labs  Lab 05/27/20 1934  GLUCAP 136*   HbA1C: No results for input(s): HGBA1C in the last 72 hours. Urine analysis:    Component Value Date/Time   COLORURINE YELLOW 05/28/2020 [place] 05/28/2020 1209   LABSPEC 1.025 05/28/2020 1209   PHURINE 5.0 05/28/2020 1209   GLUCOSEU 50 (A) 05/28/2020 1209   HGBUR NEGATIVE 05/28/2020 1209   BILIRUBINUR NEGATIVE 05/28/2020 1209   KETONESUR 5 (A) 05/28/2020 1209   PROTEINUR NEGATIVE 05/28/2020 1209   NITRITE NEGATIVE 05/28/2020 1209   LEUKOCYTESUR NEGATIVE 05/28/2020 1209   Sepsis Labs: @LABRCNTIP (procalcitonin:4,lacticidven:4) ) Recent Results (from the past 240 hour(s))  SARS CORONAVIRUS 2 (Angela Mullins 6-24 HRS) Nasopharyngeal Nasopharyngeal Swab     Status: None   Collection Time: 05/22/20  9:52 AM   Specimen: Nasopharyngeal Swab   Result Value Ref Range Status   SARS Coronavirus 2 NEGATIVE NEGATIVE Final    Comment: (NOTE) SARS-CoV-2 target nucleic acids are NOT DETECTED.  The SARS-CoV-2 RNA is generally detectable in upper and lower respiratory specimens during the acute phase of infection. Negative results do not preclude SARS-CoV-2 infection, do not rule out co-infections with other pathogens, and should not be used as the sole basis for treatment or other patient management decisions. Negative results must be combined with clinical observations, patient history, and epidemiological information. The expected result is Negative.  Fact Sheet for Patients: SugarRoll.be  Fact Sheet for Healthcare Providers: https://www.woods-mathews.com/  This test is not yet approved or cleared by the [place] FDA and  has been authorized for detection and/or diagnosis of SARS-CoV-2 by FDA under an Emergency Use Authorization (EUA). This EUA will remain  in effect (  meaning this test can be used) for the duration of the COVID-19 declaration under Se ction 564(b)(1) of the Act, 21 U.S.C. section 360bbb-3(b)(1), unless the authorization is terminated or revoked sooner.  Performed Angela Mullins [facility], [place] [address] [place], [place] [address]   Blood Culture (routine x 2)     Status: None (Preliminary result)   Collection Time: 05/27/20  7:55 PM   Specimen: BLOOD  Result Value Ref Range Status   Specimen Description BLOOD BLOOD RIGHT FOREARM  Final   Special Requests   Final    Blood Culture adequate volume BOTTLES DRAWN AEROBIC AND ANAEROBIC   Culture   Final    NO GROWTH 4 DAYS Performed Angela Mullins [facility], [address] [place], [place] [address]    Report Status PENDING  Incomplete  Blood Culture (routine x 2)     Status: None (Preliminary result)   Collection Time: 05/27/20  8:00 PM   Specimen: BLOOD  Result Value Ref Range Status   Specimen Description BLOOD BLOOD  LEFT ARM  Final   Special Requests   Final    Blood Culture adequate volume BOTTLES DRAWN AEROBIC AND ANAEROBIC   Culture   Final    NO GROWTH 4 DAYS Performed Angela Mullins [facility], [address], [place], [place] [address]    Report Status PENDING  Incomplete  Resp Panel by RT-PCR (Flu A&B, Covid) Nasopharyngeal Swab     Status: None   Collection Time: 05/27/20  8:26 PM   Specimen: Nasopharyngeal Swab; Nasopharyngeal(NP) swabs in vial transport medium  Result Value Ref Range Status   SARS Coronavirus 2 by RT PCR NEGATIVE NEGATIVE Final    Comment: (NOTE) SARS-CoV-2 target nucleic acids are NOT DETECTED.  The SARS-CoV-2 RNA is generally detectable in upper respiratory specimens during the acute phase of infection. The lowest concentration of SARS-CoV-2 viral copies this assay can detect is 138 copies/mL. A negative result does not preclude SARS-Cov-2 infection and should not be used as the sole basis for treatment or other patient management decisions. A negative result may occur with  improper specimen collection/handling, submission of specimen other than nasopharyngeal swab, presence of viral mutation(s) within the areas targeted by this assay, and inadequate number of viral copies(<138 copies/mL). A negative result must be combined with clinical observations, patient history, and epidemiological information. The expected result is Negative.  Fact Sheet for Patients:  EntrepreneurPulse.com.au  Fact Sheet for Healthcare Providers:  IncredibleEmployment.be  This test is no t yet approved or cleared by the [place] FDA and  has been authorized for detection and/or diagnosis of SARS-CoV-2 by FDA under an Emergency Use Authorization (EUA). This EUA will remain  in effect (meaning this test can be used) for the duration of the COVID-19 declaration under Section 564(b)(1) of the Act, 21 U.S.C.section 360bbb-3(b)(1), unless the authorization is  terminated  or revoked sooner.       Influenza A by PCR NEGATIVE NEGATIVE Final   Influenza B by PCR NEGATIVE NEGATIVE Final    Comment: (NOTE) The Xpert Xpress SARS-CoV-2/FLU/RSV plus assay is intended as an aid in the diagnosis of influenza from Nasopharyngeal swab specimens and should not be used as a sole basis for treatment. Nasal washings and aspirates are unacceptable for Xpert Xpress SARS-CoV-2/FLU/RSV testing.  Fact Sheet for Patients: EntrepreneurPulse.com.au  Fact Sheet for Healthcare Providers: IncredibleEmployment.be  This test is not yet approved or cleared by the [place] FDA and has been authorized for detection and/or diagnosis of  SARS-CoV-2 by FDA under an Emergency Use Authorization (EUA). This EUA will remain in effect (meaning this test can be used) for the duration of the COVID-19 declaration under Section 564(b)(1) of the Act, 21 U.S.C. section 360bbb-3(b)(1), unless the authorization is terminated or revoked.  Performed Angela Mullins [facility], [address], [place], [place] [address]   MRSA PCR Screening     Status: None   Collection Time: 05/28/20  9:01 AM   Specimen: Nasopharyngeal  Result Value Ref Range Status   MRSA by PCR NEGATIVE NEGATIVE Final    Comment:        The GeneXpert MRSA Assay (FDA approved for NASAL specimens only), is one component of a comprehensive MRSA colonization surveillance program. It is not intended to diagnose MRSA infection nor to guide or monitor treatment for MRSA infections. Performed Angela Mullins [facility], [address] [place], [place] [address]   Urine culture     Status: None   Collection Time: 05/28/20 12:09 PM   Specimen: Urine, Catheterized  Result Value Ref Range Status   Specimen Description   Final    URINE, CATHETERIZED Performed Angela Mullins [facility], [address] [place], [place] [address]    Special Requests   Final    NONE Performed Angela Mullins [facility], [address] [place], [place] [address]    Culture   Final    NO GROWTH Performed Angela Mullins [facility], [place] [address] [place], [place] [address]    Report Status 05/30/2020 FINAL  Final  CSF culture w Stat Gram Stain     Status: None (Preliminary result)   Collection Time: 05/29/20  3:30 PM   Specimen: CSF; Cerebrospinal Fluid  Result Value Ref Range Status   Specimen Description   Final    CSF Performed Angela Mullins [facility], [address] [place], [place] [address]    Special Requests   Final    NONE Performed Angela Mullins [facility], [address] [place], [place] [address]    Gram Stain   Final    NO ORGANISMS SEEN WBC PRESENT,BOTH PMN AND MONONUCLEAR Gram Stain Report Called to,Read Back By and Verified With: COLEMAN L. Angela Mullins [id number] ON [id number] BY THOMPSON S. Performed Angela Mullins [facility], [address] [place], [place] [address]    Culture   Final    NO GROWTH 2 DAYS Performed Angela Mullins [facility] [address] [place], [place] [address]    Report Status PENDING  Incomplete     Scheduled Meds: . aspirin EC  81 mg Angela Mullins Daily  . atorvastatin  80 mg Angela Mullins Daily  . doxycycline  100 mg Angela Mullins Q12H  . fenofibrate  160 mg Angela Mullins Daily  . lisinopril  10 mg Angela Mullins Daily  . loratadine  10 mg Angela Mullins Daily  . pantoprazole  40 mg Angela Mullins Daily   Continuous Infusions: . sodium chloride 50 mL/hr Angela Mullins 05/29/20 1824  . acyclovir 580 mg (05/31/20 0626)  . cefTRIAXone (ROCEPHIN)  IV 2 g (05/31/20 0540)  . lactated ringers 50 mL/hr Angela Mullins 05/29/20 1557  . vancomycin 1,000 mg (05/30/20 1843)    Procedures/Studies: CT Abdomen Pelvis Wo Contrast  Result Date: 05/27/2020 CLINICAL DATA:  Abdominal pain and fever EXAM: CT ABDOMEN AND PELVIS WITHOUT CONTRAST TECHNIQUE: Multidetector CT imaging of the abdomen and pelvis was performed following the standard protocol without IV contrast. COMPARISON:  CT February 17, 2008 FINDINGS: Lower chest: Bibasilar atelectasis/scarring. Normal size heart. No significant pericardial  effusion/thickening. Small hiatal hernia.  Hepatobiliary: Unremarkable noncontrast appearance of the hepatic parenchyma. Gallbladder is surgically absent. No biliary ductal dilation. Pancreas: Mild retroperitoneal/peripancreatic fat stranding. No pancreatic ductal dilation. Spleen: Within normal limits Adrenals/Urinary Tract: Mild apparent thickening of the the bilateral adrenal glands with adjacent inflammatory stranding. No hydronephrosis. No nephrolithiasis. No contour deforming renal masses. The urinary bladder is grossly unremarkable for degree of distension. Stomach/Bowel: Small hiatal hernia otherwise the stomach is grossly unremarkable for degree of distension. No pathologic dilation of small bowel. Tiny appendicoliths in a non appendix. No suspicious colonic wall thickening or mass like lesions. Scattered colonic diverticulosis without findings of acute diverticulitis. Vascular/Lymphatic: Aortic atherosclerosis. No enlarged abdominal or pelvic lymph nodes. Reproductive: Uterus and bilateral adnexa are unremarkable. Other: No pneumoperitoneum. Musculoskeletal: Multilevel degenerative changes spine. No acute osseous abnormality. IMPRESSION: 1. Mild retroperitoneal/peripancreatic fat stranding nonspecific but possibly representing acute pancreatitis. Correlation with serum lipase is recommended. 2. Mild apparent thickening of the bilateral adrenal glands with adjacent inflammatory stranding, which is nonspecific but may represent adrenal congestion. 3. Colonic diverticulosis without findings of acute diverticulitis. 4. Small hiatal hernia. 5. Aortic atherosclerosis. Aortic Atherosclerosis (ICD10-I70.0). Electronically Signed   By: Dahlia Bailiff MD   On: 05/27/2020 22:01   CT Angio Head W or Wo Contrast  Result Date: 05/28/2020 CLINICAL DATA:  Initial evaluation for acute headache. EXAM: CT ANGIOGRAPHY HEAD AND NECK CT VENOGRAM OF THE HEAD WITH CONTRAST. TECHNIQUE: Multidetector CT imaging of the head and  neck was performed using the standard protocol during bolus administration of intravenous contrast. Multiplanar CT image reconstructions and MIPs were obtained to evaluate the vascular anatomy. Carotid stenosis measurements (when applicable) are obtained utilizing NASCET criteria, using the distal internal carotid diameter as the denominator. CONTRAST:  57mL OMNIPAQUE IOHEXOL 350 MG/ML SOLN COMPARISON:  Prior noncontrast CT from 05/27/2020. FINDINGS: CTA NECK FINDINGS Aortic arch: Visualized aortic arch normal caliber with normal branch pattern. Mild atheromatous change about the arch and origin of the great vessels without hemodynamically significant stenosis. Right carotid system: Right common and internal carotid arteries widely patent without stenosis, dissection or occlusion. Minimal atheromatous plaque about the right bifurcation without stenosis. Left carotid system: Left CCA patent from its origin to the bifurcation without stenosis. 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 Left ICA patent distally without stenosis, dissection or occlusion. Vertebral arteries: Both vertebral arteries arise from the subclavian arteries. No proximal subclavian artery stenosis. Vertebral arteries patent within the neck without stenosis, dissection or occlusion. Skeleton: No visible acute osseous finding. No discrete or worrisome osseous lesions. Other neck: No other acute soft tissue abnormality within the neck. No mass or adenopathy. Upper chest: Advanced emphysema. Superimposed scattered patchy opacities within the posterior left greater than right lungs, could reflect atelectasis or infiltrates. Review of the MIP images confirms the above findings CTA HEAD FINDINGS Anterior circulation: Petrous segments widely patent bilaterally. Minimal atheromatous change within the carotid siphons without significant stenosis or other abnormality. A1 segments widely  patent. Normal anterior communicating artery complex. Anterior cerebral arteries patent to their distal aspects without stenosis. No M1 stenosis or occlusion. Right M1 bifurcates early. Bifurcations M cells within normal limits. Distal MCA branches well perfused and symmetric. Posterior circulation: Both V4 segments patent to the vertebrobasilar junction without stenosis. Both PICA origins patent and normal. Basilar patent to its distal aspect without stenosis. Superior cerebellar arteries patent bilaterally. Both PCAs primarily supplied via the basilar well perfused or distal aspects. Small right posterior  communicating artery noted. Venous sinuses: Normal enhancement seen throughout the superior sagittal sinus to the torcula. Torcula itself appears patent. Transverse and sigmoid sinuses are patent as are the visualized proximal internal jugular veins. Straight sinus, vein of Galen, internal cerebral veins, and basal veins of Rosenthal appear patent. No evidence for dural sinus thrombosis. Cavernous sinus appears patent. Superior orbital veins symmetric and within normal limits. Anatomic variants: None significant. No aneurysm or other vascular abnormality. Review of the MIP images confirms the above findings IMPRESSION: 1. Negative CTA for large vessel occlusion. No high-grade or correctable stenosis within the major arterial vasculature of the head and neck. No aneurysm. 2. 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 3. Negative CT venogram.  No evidence for dural sinus thrombosis. 4. Emphysema (ICD10-J43.9). Superimposed patchy opacities within the posterior left greater than right lungs could reflect atelectasis or infiltrates. Correlation with physical exam and any potential symptomatology recommended. Electronically Signed   By: Jeannine Boga M.D.   On: 05/28/2020 04:07   CT HEAD WO CONTRAST  Result Date: 05/27/2020 CLINICAL DATA:   Headache.  Dysarthria EXAM: CT HEAD WITHOUT CONTRAST TECHNIQUE: Contiguous axial images were obtained from the base of the skull through the vertex without intravenous contrast. COMPARISON:  None. FINDINGS: Brain: Ventricles and sulci are normal in size and configuration. There is no intracranial mass, hemorrhage, extra-axial fluid collection, or midline shift. Brain parenchyma appears unremarkable. No evident acute infarct. Vascular: No hyperdense vessel.  No evident vascular calcification. Skull: Bony calvarium appears intact. Sinuses/Orbits: There are small retention cysts in each inferior maxillary antrum. There is a small retention cyst in a posterior right ethmoid air cell. There is rightward deviation of the nasal septum. Orbits appear symmetric bilaterally. Other: Mastoid air cells are clear. There is debris in the right external auditory canal. IMPRESSION: Normal appearing brain parenchyma. No acute infarct. No mass or hemorrhage. Foci of mild paranasal sinus disease. Deviated nasal septum. Probable cerumen in the right external auditory canal. Electronically Signed   By: Lowella Grip III M.D.   On: 05/27/2020 20:00   CT Angio Neck W and/or Wo Contrast  Result Date: 05/28/2020 CLINICAL DATA:  Initial evaluation for acute headache. EXAM: CT ANGIOGRAPHY HEAD AND NECK CT VENOGRAM OF THE HEAD WITH CONTRAST. TECHNIQUE: Multidetector CT imaging of the head and neck was performed using the standard protocol during bolus administration of intravenous contrast. Multiplanar CT image reconstructions and MIPs were obtained to evaluate the vascular anatomy. Carotid stenosis measurements (when applicable) are obtained utilizing NASCET criteria, using the distal internal carotid diameter as the denominator. CONTRAST:  65mL OMNIPAQUE IOHEXOL 350 MG/ML SOLN COMPARISON:  Prior noncontrast CT from 05/27/2020. FINDINGS: CTA NECK FINDINGS Aortic arch: Visualized aortic arch normal caliber with normal branch pattern.  Mild atheromatous change about the arch and origin of the great vessels without hemodynamically significant stenosis. Right carotid system: Right common and internal carotid arteries widely patent without stenosis, dissection or occlusion. Minimal atheromatous plaque about the right bifurcation without stenosis. Left carotid system: Left CCA patent from its origin to the bifurcation without stenosis. 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 Left ICA patent distally without stenosis, dissection or occlusion. Vertebral arteries: Both vertebral arteries arise from the subclavian arteries. No proximal subclavian artery stenosis. Vertebral arteries patent within the neck without stenosis, dissection or occlusion. Skeleton: No visible acute osseous finding. No discrete  or worrisome osseous lesions. Other neck: No other acute soft tissue abnormality within the neck. No mass or adenopathy. Upper chest: Advanced emphysema. Superimposed scattered patchy opacities within the posterior left greater than right lungs, could reflect atelectasis or infiltrates. Review of the MIP images confirms the above findings CTA HEAD FINDINGS Anterior circulation: Petrous segments widely patent bilaterally. Minimal atheromatous change within the carotid siphons without significant stenosis or other abnormality. A1 segments widely patent. Normal anterior communicating artery complex. Anterior cerebral arteries patent to their distal aspects without stenosis. No M1 stenosis or occlusion. Right M1 bifurcates early. Bifurcations M cells within normal limits. Distal MCA branches well perfused and symmetric. Posterior circulation: Both V4 segments patent to the vertebrobasilar junction without stenosis. Both PICA origins patent and normal. Basilar patent to its distal aspect without stenosis. Superior cerebellar arteries patent bilaterally. Both PCAs primarily supplied via the  basilar well perfused or distal aspects. Small right posterior communicating artery noted. Venous sinuses: Normal enhancement seen throughout the superior sagittal sinus to the torcula. Torcula itself appears patent. Transverse and sigmoid sinuses are patent as are the visualized proximal internal jugular veins. Straight sinus, vein of Galen, internal cerebral veins, and basal veins of Rosenthal appear patent. No evidence for dural sinus thrombosis. Cavernous sinus appears patent. Superior orbital veins symmetric and within normal limits. Anatomic variants: None significant. No aneurysm or other vascular abnormality. Review of the MIP images confirms the above findings IMPRESSION: 1. Negative CTA for large vessel occlusion. No high-grade or correctable stenosis within the major arterial vasculature of the head and neck. No aneurysm. 2. 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 3. Negative CT venogram.  No evidence for dural sinus thrombosis. 4. Emphysema (ICD10-J43.9). Superimposed patchy opacities within the posterior left greater than right lungs could reflect atelectasis or infiltrates. Correlation with physical exam and any potential symptomatology recommended. Electronically Signed   By: Jeannine Boga M.D.   On: 05/28/2020 04:07   MR BRAIN W WO CONTRAST  Result Date: 05/31/2020 CLINICAL DATA:  Sarcoidosis; encephalitis. EXAM: MRI HEAD WITHOUT AND WITH CONTRAST TECHNIQUE: Multiplanar, multiecho pulse sequences of the brain and surrounding structures were obtained without and with intravenous contrast. CONTRAST:  4mL GADAVIST GADOBUTROL 1 MMOL/ML IV SOLN COMPARISON:  Head CT May 27, 2020 FINDINGS: Brain: No acute infarction, hemorrhage, hydrocephalus, extra-axial collection or mass lesion. Rare foci of T2 hyperintensity are seen within white matter of the cerebral hemispheres, nonspecific. No focus of abnormal contrast enhancement  Vascular: Normal flow voids. Skull and upper cervical spine: Normal marrow signal. Sinuses/Orbits: Small mucous retention cyst in the bilateral maxillary sinuses. The orbits maintained. Other: Mild mucosal thickening of the right mastoid cells. IMPRESSION: Minimal amount of nonspecific T2 hyperintense lesions the white matter, may represent early microvascular ischemic changes. No focus of abnormal contrast enhancement. Electronically Signed   By: Pedro Earls M.D.   On: 05/31/2020 10:39   DG Chest Port 1 View  Result Date: 05/27/2020 CLINICAL DATA:  Questionable sepsis - evaluate for abnormality EXAM: PORTABLE CHEST 1 VIEW COMPARISON:  Radiograph 01/28/2006 FINDINGS: The cardiomediastinal contours are normal. Chronic blunting of left costophrenic angle. Mild interstitial coarsening. Pulmonary vasculature is normal. No consolidation, pleural effusion, or pneumothorax. No acute osseous abnormalities are seen. IMPRESSION: 1. Mild interstitial coarsening of unknown acuity, likely chronic. 2. Chronic blunting of the left costophrenic angle. Electronically Signed   By: Keith Rake M.D.   On: 05/27/2020 20:41  Donell Sievert [facility] SPEECH PATH  Result Date: 05/18/2020 [place] [address] [place], [place], [address] Phone: [phone]   Fax:  [phone] Modified Barium Swallow Patient Details Name: Angela Mullins MRN: [id number] Date of Birth: 02-09-1955 No data recorded Encounter Date: 05/18/2020  End of Session - 05/18/20 1254   Visit Number 1   Number of Visits 1   Authorization Type [employer]   SLP Start Time [id number]   SLP Stop Time  1215   SLP Time Calculation (min) 30 min   Activity Tolerance Patient tolerated treatment well     Past Medical History: Diagnosis Date . Anxiety  . GERD (gastroesophageal reflux disease)  . Hypercholesteremia  . Hypertension  . Vitamin D deficiency  Past Surgical History: Procedure Laterality Date . CHOLECYSTECTOMY   . PELVIC  FRACTURE SURGERY   . WISDOM TOOTH EXTRACTION   There were no vitals filed for this visit.  Subjective Assessment - 05/18/20 1247   Subjective "I have to throw my head back to swallow pills."   Special Tests MBSS   Currently in Pain? No/denies      General - 05/18/20 1248    General Information  Date of Onset 05/10/20   HPI Jasara Darbyshire is a 65 yo female who was referred by Dr. Maylon Peppers for MBSS due to Pt with reports of dysphagia. Pt with PMH significant for anxiety, HTN, HLD, and GERD. Patient reports that since she was a teenager she has felt recurrent episodes of dysphagia when swallowing pills, very occasionally when swallowing solid food.  She reported that she tries to chew very well reported to avoid having any choking episodes.  However, she reported that she feels "strangled" when food goes occasionally as if there was some sort of pressure in her throat.  Does not have any symptoms when swallowing liquids.  More recently, for the last year she has presented episodes of feeling a foreign body in her throat as "if something was lodged in her throat".  She constantly clears her throat but reports no phlegm comes out. She quit smoking last year. Her Prilosec was increased to 40 mg twice per day last week. She does not sleep with the HOB elevated, but uses a wedge. No recent reports of PNA.   Type of Study MBS-Modified Barium Swallow Study   Previous Swallow Assessment None on record   Diet Prior to this Study Regular;Thin liquids   Temperature Spikes Noted No   Respiratory Status Room air   History of Recent Intubation No   Behavior/Cognition Alert;Cooperative;Pleasant mood   Angela Mullins Cavity Assessment Within Functional Limits   Angela Mullins Care Completed by SLP No   Angela Mullins Cavity - Dentition Adequate natural dentition   Vision Functional for self feeding   Self-Feeding Abilities Able to feed self   Patient Positioning Upright in chair   Baseline Vocal Quality Normal   Volitional Cough Strong   Volitional Swallow  Able to elicit   Anatomy Within functional limits   Pharyngeal Secretions Not observed secondary MBS      Angela Mullins Preparation/Angela Mullins Phase - 05/18/20 1248    Angela Mullins Preparation/Angela Mullins Phase  Angela Mullins Phase Impaired    Angela Mullins - Thin  Angela Mullins - Thin Teaspoon Within functional limits   Angela Mullins - Thin Cup Within functional limits    Angela Mullins - Solids  Angela Mullins - Puree Within functional limits   Angela Mullins - Regular Within functional limits   Angela Mullins - Pill Within functional limits    Electrical  stimulation - Angela Mullins Phase  Was Electrical Stimulation Used No      Pharyngeal Phase - 05/18/20 1250    Pharyngeal Phase  Pharyngeal Phase Within functional limits      Cricopharyngeal Phase - 05/18/20 1250    Cervical Esophageal Phase  Cervical Esophageal Phase Impaired    Cervical Esophageal Phase - Comment  Cervical Esophageal Comment Pt points to ~C7 as source of globus with solids and pill   Other Esophageal Phase Observations Pt with delayed esophageal transit, incomplete clearance with primary wave, stasis of pill near LES which cleared with puree wash (did not clear with thin wash)      Plan - 05/18/20 1254   Clinical Impression Statement MBSS completed. Pt assessed with barium tinged thin, puree, regular textures, and barium tablet. Oropharyngeal swallow is WNL with the exception of Pt producing effortful swallows with solids, however suspect this is due to esophageal component. Swallow initiation was timely and hyolaryngeal excursion WNL, no penetration/aspiration or significant pharyngeal residuals post swallow. Esophageal sweep completed before and after presentation of barium table revealed incomplete clearance of barium via primary wave. The barium tablet became transiently delayed near the LES and was not cleared by liquid wash, but was cleared with a bite of puree. Pt reports that she typically takes larger pills balled up into small pieces of bread to facilitate swallow. Pt's reports of globus, effortful swallows, non productive coughing, and  regurgitation appear more consistent with esophageal dysphagia. She was recently increased to prilosec 2x per day. Pt was encouraged to alternate solids and liquids, adhere to reflux precautions, and elevate the head of her bed. No further SLP services indicated Angela Mullins this time. Pt has EGD scheduled for Tuesday with Dr. Jenetta Downer.   Consulted and Agree with Plan of Care Patient     Patient will benefit from skilled therapeutic intervention in order to improve the following deficits and impairments:  Dysphagia, oropharyngeal phase  Recommendations/Treatment - 05/18/20 1252    Swallow Evaluation Recommendations  Recommended Consults Consider esophageal assessment   SLP Diet Recommendations Age appropriate regular;Thin   Liquid Administration via Cup   Medication Administration Whole meds with liquid   Supervision Patient able to self feed   Compensations Follow solids with liquid   Postural Changes Seated upright Angela Mullins 90 degrees;Remain upright for Angela Mullins least 30 minutes after feeds/meals      Prognosis - 05/18/20 1253    Prognosis  Prognosis for Safe Diet Advancement Good   Barriers/Prognosis Comment suspected primary  esophageal dysphagia    Individuals Consulted  Consulted and Agree with Results and Recommendations Patient   Report Sent to  Referring physician     Problem List Patient Active Problem List  Diagnosis Date Noted . Dysphagia 05/10/2020 . Throat discomfort 05/10/2020 . GERD (gastroesophageal reflux disease) 05/10/2020 Thank you, Genene Churn, [place] 05/18/2020, 1:01 PM [place] [address] [place], [place], [address] Phone: [phone]   Fax:  [phone] Name: Angela Mullins MRN: [id number] Date of Birth: 10-27-1955  CT VENOGRAM HEAD  Result Date: 05/28/2020 CLINICAL DATA:  Initial evaluation for acute headache. EXAM: CT ANGIOGRAPHY HEAD AND NECK CT VENOGRAM OF THE HEAD WITH CONTRAST. TECHNIQUE: Multidetector CT imaging of the head and  neck was performed using the standard protocol during bolus administration of intravenous contrast. Multiplanar CT image reconstructions and MIPs were obtained to evaluate the vascular anatomy. Carotid stenosis measurements (when applicable) are obtained utilizing NASCET criteria, using the distal internal carotid diameter  as the denominator. CONTRAST:  60mL OMNIPAQUE IOHEXOL 350 MG/ML SOLN COMPARISON:  Prior noncontrast CT from 05/27/2020. FINDINGS: CTA NECK FINDINGS Aortic arch: Visualized aortic arch normal caliber with normal branch pattern. Mild atheromatous change about the arch and origin of the great vessels without hemodynamically significant stenosis. Right carotid system: Right common and internal carotid arteries widely patent without stenosis, dissection or occlusion. Minimal atheromatous plaque about the right bifurcation without stenosis. Left carotid system: Left CCA patent from its origin to the bifurcation without stenosis. 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 Left ICA patent distally without stenosis, dissection or occlusion. Vertebral arteries: Both vertebral arteries arise from the subclavian arteries. No proximal subclavian artery stenosis. Vertebral arteries patent within the neck without stenosis, dissection or occlusion. Skeleton: No visible acute osseous finding. No discrete or worrisome osseous lesions. Other neck: No other acute soft tissue abnormality within the neck. No mass or adenopathy. Upper chest: Advanced emphysema. Superimposed scattered patchy opacities within the posterior left greater than right lungs, could reflect atelectasis or infiltrates. Review of the MIP images confirms the above findings CTA HEAD FINDINGS Anterior circulation: Petrous segments widely patent bilaterally. Minimal atheromatous change within the carotid siphons without significant stenosis or other abnormality. A1 segments widely  patent. Normal anterior communicating artery complex. Anterior cerebral arteries patent to their distal aspects without stenosis. No M1 stenosis or occlusion. Right M1 bifurcates early. Bifurcations M cells within normal limits. Distal MCA branches well perfused and symmetric. Posterior circulation: Both V4 segments patent to the vertebrobasilar junction without stenosis. Both PICA origins patent and normal. Basilar patent to its distal aspect without stenosis. Superior cerebellar arteries patent bilaterally. Both PCAs primarily supplied via the basilar well perfused or distal aspects. Small right posterior communicating artery noted. Venous sinuses: Normal enhancement seen throughout the superior sagittal sinus to the torcula. Torcula itself appears patent. Transverse and sigmoid sinuses are patent as are the visualized proximal internal jugular veins. Straight sinus, vein of Galen, internal cerebral veins, and basal veins of Rosenthal appear patent. No evidence for dural sinus thrombosis. Cavernous sinus appears patent. Superior orbital veins symmetric and within normal limits. Anatomic variants: None significant. No aneurysm or other vascular abnormality. Review of the MIP images confirms the above findings IMPRESSION: 1. Negative CTA for large vessel occlusion. No high-grade or correctable stenosis within the major arterial vasculature of the head and neck. No aneurysm. 2. 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 3. Negative CT venogram.  No evidence for dural sinus thrombosis. 4. Emphysema (ICD10-J43.9). Superimposed patchy opacities within the posterior left greater than right lungs could reflect atelectasis or infiltrates. Correlation with physical exam and any potential symptomatology recommended. Electronically Signed   By: Jeannine Boga M.D.   On: 05/28/2020 04:07   DG FLUORO GUIDE LUMBAR PUNCTURE  Result Date:  05/29/2020 CLINICAL DATA:  Headache EXAM: DIAGNOSTIC LUMBAR PUNCTURE UNDER FLUOROSCOPIC GUIDANCE COMPARISON:  Head CT 05/27/2020 FLUOROSCOPY TIME:  Fluoroscopy Time:  20 seconds Radiation Exposure Index (if provided by the fluoroscopic device): 1.1 mGy Number of Acquired Spot Images: 0 PROCEDURE: Informed consent was obtained from the patient prior to the procedure, including potential complications of headache, allergy, and pain. With the patient prone, the lower back was prepped with Betadine. 1% Lidocaine was used for local anesthesia. Lumbar puncture was performed Angela Mullins the L2-3 level using a 20 gauge gauge needle with return of  clear CSF with an opening pressure of 35 cm water. Twelve ml of CSF were obtained for laboratory studies. The patient tolerated the procedure well and there were no apparent complications. IMPRESSION: Technically successful lumbar puncture under fluoroscopic guidance as above. Electronically Signed   By: Rolm Baptise M.D.   On: 05/29/2020 16:14    Orson Eva, DO  [facility]  If 7PM-7AM, please contact night-coverage [email or web address] Password [email or web address] 05/31/2020, 12:54 PM   LOS: 3 days

## 2020-05-31 NOTE — Progress Notes (Signed)
[place] A. Merlene Laughter, MD     [email or web address]          Angela Mullins is an 65 y.o. female.   ASSESSMENT/PLAN: 1.  Subacute/acute headache syndrome with CSF analysis consistent with meningitis.  The patient has been started on broad-spectrum antibiotics.  Given that he is history of insect bite and the lack of systemic symptoms I suspect that rickettsial infection or Lyme are almost most likely.  Other concerns includes cryptococcal meningitis and herpes simplex infection.  The patient has been started on broad-spectrum antibiotics and antiviral agents.  Doxycycline will also be initiated.  As it is unlikely that the patient has bacterial meningitis, steroids is not recommended.   However, the patient has been started on ceftriaxone and vancomycin. TB meningitis is also unlikely.  I will attempt to do additional analysis of the spinal fluid for cryptococcal antigen and herpes.  Rickettsial antibodies will also be obtained and also antibodies for Lyme disease.  CSF culture Gram stain and blood cultures so far negative.  Continue with the current plan and follow-up with cultures on the other testing.   The patient's son and husband is at the bedside.  She continues to report neck pain and headaches.  No confusion or drowsiness is reported today.  She visibly appears to be better.  Imaging results are reviewed and discussed with them.   GENERAL: She appears to be uncomfortable  HEENT: Mild neck stiffness; no trauma noted  ABDOMEN: soft  EXTREMITIES: No edema; no clear Kernig signs  BACK: Normal  SKIN: 2 areas of erythema about 5 cm in the abdominal region below the umbilicus.    MENTAL STATUS: Alert and oriented. Speech, language and cognition are generally intact. Judgment and insight normal.   CRANIAL NERVES: Pupils are equal, round and reactive to light and accomodation; extra ocular movements are full, there is no significant nystagmus; visual fields are full;  upper and lower facial muscles are normal in strength and symmetric, there is no flattening of the nasolabial folds; tongue is midline; uvula is midline; shoulder elevation is normal.  MOTOR: Normal tone, bulk and strength; no pronator drift.  COORDINATION: Left finger to nose is normal, right finger to nose is normal, No rest tremor; no intention tremor; no postural tremor; no bradykinesia.  REFLEXES: Deep tendon reflexes are symmetrical and normal.   SENSATION: Normal to light touch, temperature, and pain.       Blood pressure (!) 159/98, pulse 84, temperature 98.3 F (36.8 C), temperature source Oral, resp. rate 19, height 5' 3.75" (1.619 m), weight 58.1 kg, SpO2 99 %.  Past Medical History:  Diagnosis Date  . Anxiety   . Aortic atherosclerosis ([place]) 05/28/2020  . Emphysema/COPD ([place]) 05/28/2020  . GERD (gastroesophageal reflux disease)   . Hypercholesteremia   . Hypertension   . Vitamin D deficiency     Past Surgical History:  Procedure Laterality Date  . BIOPSY  05/23/2020   Procedure: BIOPSY;  Surgeon: Harvel Quale, MD;  [facility];  Service: Gastroenterology;;  gastric esophageal  . CHOLECYSTECTOMY    . COLONOSCOPY WITH PROPOFOL N/A 05/23/2020   Procedure: COLONOSCOPY WITH PROPOFOL;  Surgeon: Harvel Quale, MD;  [facility];  Service: Gastroenterology;  Laterality: N/A;  10:45 AM  . ESOPHAGEAL DILATION N/A 05/23/2020   Procedure: ESOPHAGEAL DILATION;  Surgeon: Harvel Quale, MD;  [facility];  Service: Gastroenterology;  Laterality: N/A;  . ESOPHAGOGASTRODUODENOSCOPY (EGD) WITH PROPOFOL N/A 05/23/2020  Procedure: ESOPHAGOGASTRODUODENOSCOPY (EGD) WITH PROPOFOL;  Surgeon: Harvel Quale, MD;  [facility];  Service: Gastroenterology;  Laterality: N/A;  . PELVIC FRACTURE SURGERY    . POLYPECTOMY  05/23/2020   Procedure: POLYPECTOMY;  Surgeon: Harvel Quale, MD;   [facility];  Service: Gastroenterology;;  colon  . WISDOM TOOTH EXTRACTION      Family History  Problem Relation Age of Onset  . Alzheimer's disease Mother   . Heart disease Father   . Diabetes Sister   . Heart disease Sister   . Hypertension Brother     Social History:  reports that she has quit smoking. She has never used smokeless tobacco. She reports current alcohol use. She reports that she does not use drugs.  Allergies:  Allergies  Allergen Reactions  . Demerol [Meperidine Hcl] Other (See Comments)    Does not help  . Quinine Derivatives Nausea And Vomiting    Medications: Prior to Admission medications   Medication Sig Start Date End Date Taking? Authorizing Provider  aspirin EC 81 MG tablet Take 81 mg by mouth daily. Swallow whole.   Yes [provider]  atorvastatin (LIPITOR) 80 MG tablet Take 80 mg by mouth daily.   Yes [provider]  cetirizine (ZYRTEC) 10 MG tablet Take 10 mg by mouth daily.   Yes [provider]  clonazePAM (KLONOPIN) 0.5 MG tablet Take 0.5 mg by mouth 2 (two) times daily.   Yes [provider]  diphenhydrAMINE (BENADRYL) 25 mg capsule Take 25 mg by mouth every 8 (eight) hours as needed for itching (Swelling).   Yes [provider]  eletriptan (RELPAX) 40 MG tablet Take 40 mg by mouth every 2 (two) hours as needed for migraine. 05/26/20  Yes [provider]  ergocalciferol (VITAMIN D2) 1.25 MG (50000 UT) capsule Take 50,000 Units by mouth once a week.   Yes [provider]  fenofibrate micronized (LOFIBRA) 200 MG capsule Take 200 mg by mouth daily before breakfast.   Yes [provider]  Halcinonide (HALOG) 0.1 % OINT Apply 1 application topically daily as needed (on bite).   Yes [provider]  ibuprofen (ADVIL) 200 MG tablet Take 400 mg by mouth daily as needed for headache.   Yes [provider]  lisinopril (ZESTRIL) 10 MG tablet Take 10 mg by  mouth daily.   Yes [provider]  Multiple Vitamins-Minerals (MULTIVITAMIN WITH MINERALS) tablet Take 1 tablet by mouth daily. 50 +   Yes [provider]  omeprazole (PRILOSEC) 40 MG capsule Take 40 mg by mouth 2 (two) times daily.   Yes [provider]  Potassium 99 MG TABS Take by mouth daily. Patient not taking: Reported on 05/27/2020    [provider]    Scheduled Meds: . aspirin EC  81 mg Oral Daily  . atorvastatin  80 mg Oral Daily  . doxycycline  100 mg Oral Q12H  . fenofibrate  160 mg Oral Daily  . lisinopril  10 mg Oral Daily  . loratadine  10 mg Oral Daily  . pantoprazole  40 mg Oral Daily   Continuous Infusions: . sodium chloride 50 mL/hr at 05/29/20 1824  . acyclovir 112 mL/hr at 05/31/20 1436  . cefTRIAXone (ROCEPHIN)  IV Stopped (05/31/20 0610)  . lactated ringers 50 mL/hr at 05/29/20 1557  . vancomycin 1,000 mg (05/30/20 1843)   PRN Meds:.acetaminophen **OR** acetaminophen, clonazePAM, diphenhydrAMINE, ondansetron **OR** ondansetron (ZOFRAN) IV, SUMAtriptan  Results for orders placed or performed during the hospital encounter of 05/27/20 (from the past 48 hour(s))  Cryptococcal antigen, CSF     Status: None   Collection Time: 05/29/20  5:48 PM  Result Value Ref Range   Crypto Ag NEGATIVE NEGATIVE   Cryptococcal Ag Titer NOT INDICATED NOT INDICATED    Comment: Performed at [facility], [address], [place], [place] [address]  Herpes simplex virus (HSV), DNA by PCR Sterile Swab     Status: None   Collection Time: 05/29/20  5:48 PM  Result Value Ref Range   Source of Sample CSF     Comment: Performed at [facility], [address] [place], [place] [address]   HSV 1 DNA WRORD     Comment: (NOTE) Test not performed. The required specimen for the test ordered was not received.      [facility] Level [facility]. was notified 05/30/2020.    HSV 2 DNA NOT PERFORMED     Comment: (NOTE) Test not performed Performed At: [facility] [place], [place] [id number] Rush Farmer MD [id number]   RPR     Status: None   Collection Time: 05/29/20  6:02 PM  Result Value Ref Range   RPR Ser Ql NON REACTIVE NON REACTIVE    Comment: Performed at [facility], [address], [place], [place] [address]  Vitamin B12     Status: None   Collection Time: 05/29/20  6:02 PM  Result Value Ref Range   Vitamin B-12 244 180 - 914 pg/mL    Comment: (NOTE) This assay is not validated for testing neonatal or myeloproliferative syndrome specimens for Vitamin B12 levels. Performed at [facility], [address], [place], [place] [address]   Comprehensive metabolic panel     Status: Abnormal   Collection Time: 05/30/20  5:24 AM  Result Value Ref Range   Sodium 141 135 - 145 mmol/L   Potassium 3.3 (L) 3.5 - 5.1 mmol/L   Chloride 107 98 - 111 mmol/L   CO2 26 22 - 32 mmol/L   Glucose, Bld 94 70 - 99 mg/dL    Comment: Glucose reference range applies only to samples taken after fasting for at least 8 hours.   BUN 19 8 - 23 mg/dL   Creatinine, Ser 0.58 0.44 - 1.00 mg/dL   Calcium 8.6 (L) 8.9 - 10.3 mg/dL   Total Protein 5.4 (L) 6.5 - 8.1 g/dL   Albumin 2.4 (L) 3.5 - 5.0 g/dL   AST 26 15 - 41 U/L   ALT 49 (H) 0 - 44 U/L   Alkaline Phosphatase 121 38 - 126 U/L   Total Bilirubin 0.5 0.3 - 1.2 mg/dL   GFR, Estimated >60 >60 mL/min    Comment: (NOTE) Calculated using the CKD-EPI Creatinine Equation (2021)    Anion gap 8 5 - 15    Comment: Performed at [facility], [address] [place], [place] [address]  CBC     Status: Abnormal   Collection Time: 05/30/20  5:24 AM  Result Value Ref Range   WBC 6.3 4.0 - 10.5 K/uL   RBC 4.03 3.87 - 5.11 MIL/uL   Hemoglobin 12.0 12.0 - 15.0 g/dL   HCT 35.8 (L) 36.0 - 46.0 %   MCV 88.8 80.0 - 100.0 fL   MCH 29.8 26.0 - 34.0 pg   MCHC 33.5 30.0 - 36.0 g/dL   RDW 12.4 11.5 - 15.5 %   Platelets 224  150 - 400 K/uL   nRBC 0.0 0.0 - 0.2 %    Comment: Performed at  [facility], [address], [place], [place] [address]  Basic metabolic panel     Status: Abnormal   Collection Time: 05/31/20  5:54 AM  Result Value Ref Range   Sodium 142 135 - 145 mmol/L   Potassium 3.8 3.5 - 5.1 mmol/L   Chloride 107 98 - 111 mmol/L   CO2 26 22 - 32 mmol/L   Glucose, Bld 147 (H) 70 - 99 mg/dL    Comment: Glucose reference range applies only to samples taken after fasting for at least 8 hours.   BUN 13 8 - 23 mg/dL   Creatinine, Ser 0.53 0.44 - 1.00 mg/dL   Calcium 9.0 8.9 - 10.3 mg/dL   GFR, Estimated >60 >60 mL/min    Comment: (NOTE) Calculated using the CKD-EPI Creatinine Equation (2021)    Anion gap 9 5 - 15    Comment: Performed at [facility], [address] [place], [place] [address]  Hemoglobin A1c     Status: Abnormal   Collection Time: 05/31/20  1:43 PM  Result Value Ref Range   Hgb A1c MFr Bld 5.7 (H) 4.8 - 5.6 %    Comment: (NOTE) Pre diabetes:          5.7%-6.4%  Diabetes:              >6.4%  Glycemic control for   <7.0% adults with diabetes    Mean Plasma Glucose 116.89 mg/dL    Comment: Performed at [facility] [address] [place], [place] [address]    Studies/Results: HEAD CT  FINDINGS: Brain: Ventricles and sulci are normal in size and configuration. There is no intracranial mass, hemorrhage, extra-axial fluid collection, or midline shift. Brain parenchyma appears unremarkable. No evident acute infarct.  Vascular: No hyperdense vessel.  No evident vascular calcification.  Skull: Bony calvarium appears intact.  Sinuses/Orbits: There are small retention cysts in each inferior maxillary antrum. There is a small retention cyst in a posterior right ethmoid air cell. There is rightward deviation of the nasal septum. Orbits appear symmetric bilaterally.  Other: Mastoid air cells are clear. There is debris in the right external auditory canal.  IMPRESSION: Normal appearing brain parenchyma. No acute infarct. No  mass or hemorrhage.  Foci of mild paranasal sinus disease. Deviated nasal septum. Probable cerumen in the right external auditory canal    HEAD NECK CTA IMPRESSION: 1. Negative CTA for large vessel occlusion. No high-grade or correctable stenosis within the major arterial vasculature of the head and neck. No aneurysm. 2. 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 3. Negative CT venogram.  No evidence for dural sinus thrombosis. 4. Emphysema (ICD10-J43.9). Superimposed patchy opacities within the posterior left greater than right lungs could reflect atelectasis or infiltrates. Correlation with physical exam and any potential symptomatology recommended    CSF -- WBC 318 RBC 69 segs 45% which is elevated; glucose low at 29 and protein 97    BRAIN MRI W/WO FINDINGS: Brain: No acute infarction, hemorrhage, hydrocephalus, extra-axial collection or mass lesion. Rare foci of T2 hyperintensity are seen within white matter of the cerebral hemispheres, nonspecific. No focus of abnormal contrast enhancement  Vascular: Normal flow voids.  Skull and upper cervical spine: Normal marrow signal.  Sinuses/Orbits: Small mucous retention cyst in the bilateral maxillary sinuses. The orbits maintained.  Other:  Mild mucosal thickening of the right mastoid cells.  IMPRESSION: Minimal amount of nonspecific T2 hyperintense lesions the white matter, may represent early microvascular ischemic changes. No focus of abnormal contrast enhancement.       BRAIN MRI SCAN IS REVIEWED IN PERSON. No acute lesions are noted on DWI. AST below eyes also normal. There is no evidence of encephalomalacia or abnormal enhancement.  No significant white matter disease or atrophy is noted for age.The scan is essentially normal.    Varie Machamer A. Merlene Laughter, M.D.  Diplomate, Tax adviser of Psychiatry and Neurology ( Neurology). 05/31/2020,  5:05 PM

## 2020-05-31 NOTE — Evaluation (Signed)
Physical Therapy Evaluation Patient Details Name: Angela Mullins MRN: [id number] DOB: 1955-04-15 Today's Date: 05/31/2020   History of Present Illness  Angela Mullins is a 65 y.o. female with medical history significant of anxiety, GERD, hyperlipidemia, hypertension, vitamin D deficiency who is coming to the emergency department due to headache, similar to her migraines, but more intense than usual that has not responded to Relpax for the past 48 hours associated with nausea, vomiting and generalized weakness.  Her husband also stated in the ED that her mentation has been decreased. She had a colonoscopy on Tuesday with some polypectomies, but denies abdominal pain, diarrhea, constipation, melena hematochezia..  Wednesday was uneventful, but then developed symptoms of Thursday.  EMS was called, but subsequently they decided not to come to the ED.   No history of fever at home, but was febrile at 101 F in the emergency department.  No sore throat, cough, dyspnea, wheezing or hemoptysis.  No chest pain, palpitations, lower extremity edema.  No dysuria, frequency or h    Clinical Impression  Patient functioning near baseline for functional mobility and gait.  Patient demonstrates slightly labored movement for sitting up at bedside, unsteady on feet requiring hand held assist, safer using RW demonstrating good return for use and able to ambulate in hallway without loss of balance, on room air with SpO2 at 95-98% - RN notified.  Patient put back to bed to have IV insertion completed by nursing staff.  Patient will benefit from continued physical therapy in hospital and recommended venue below to increase strength, balance, endurance for safe ADLs and gait.     Follow Up Recommendations Home health PT;Supervision for mobility/OOB;Supervision - Intermittent    Equipment Recommendations  Rolling walker with 5" wheels    Recommendations for Other Services       Precautions / Restrictions  Precautions Precautions: Fall Restrictions Weight Bearing Restrictions: No      Mobility  Bed Mobility Overal bed mobility: Modified Independent             General bed mobility comments: slightly increased time    Transfers Overall transfer level: Needs assistance Equipment used: Rolling walker (2 wheeled);1 person hand held assist;None Transfers: Sit to/from [facility] Sit to Stand: Supervision;Min guard Stand pivot transfers: Supervision;Min guard       General transfer comment: unsteady on feet requiring hand held assist without AD, safer using RW  Ambulation/Gait Ambulation/Gait assistance: Supervision;Min guard Gait Distance (Feet): 100 Feet Assistive device: Rolling walker (2 wheeled) Gait Pattern/deviations: Decreased step length - right;Decreased step length - left;Decreased stride length Gait velocity: decreased   General Gait Details: slightly labored cadence without loss of balance using RW, limited mostly due to fatigue, on room air with SpO2 at 95-98%  Stairs            Wheelchair Mobility    Modified Rankin (Stroke Patients Only)       Balance Overall balance assessment: Needs assistance Sitting-balance support: Feet supported;No upper extremity supported Sitting balance-Leahy Scale: Good Sitting balance - Comments: seated at EOB   Standing balance support: During functional activity;No upper extremity supported Standing balance-Leahy Scale: Poor Standing balance comment: fair/poor without AD, fair/good using RW                             Pertinent Vitals/Pain Pain Assessment: Faces Faces Pain Scale: Hurts little more Pain Location: headache Pain Descriptors / Indicators: Headache Pain Intervention(s): Limited activity within  patient's tolerance;Monitored during session    Home Living Family/patient expects to be discharged to:: Private residence Living Arrangements: Spouse/significant  other Available Help at Discharge: Family;Available PRN/intermittently Type of Home: House Home Access: Stairs to enter Entrance Stairs-Rails: None Entrance Stairs-Number of Steps: 1 Home Layout: One level Home Equipment: Cane - single point      Prior Function Level of Independence: Independent         Comments: community ambulator, drives     Journalist, newspaper        Extremity/Trunk Assessment   Upper Extremity Assessment Upper Extremity Assessment: Generalized weakness    Lower Extremity Assessment Lower Extremity Assessment: Generalized weakness    Cervical / Trunk Assessment Cervical / Trunk Assessment: Normal  Communication   Communication: No difficulties  Cognition Arousal/Alertness: Awake/alert Behavior During Therapy: WFL for tasks assessed/performed Overall Cognitive Status: Within Functional Limits for tasks assessed                                        General Comments      Exercises     Assessment/Plan    PT Assessment Patient needs continued PT services  PT Problem List Decreased strength;Decreased activity tolerance;Decreased balance;Decreased mobility       PT Treatment Interventions DME instruction;Gait training;Stair training;Functional mobility training;Therapeutic activities;Therapeutic exercise;Patient/family education;Balance training    PT Goals (Current goals can be found in the Care Plan section)  Acute Rehab PT Goals Patient Stated Goal: return home with family to assist PT Goal Formulation: With patient/family Time For Goal Achievement: 06/07/20 Potential to Achieve Goals: Good    Frequency Min 3X/week   Barriers to discharge        Co-evaluation               AM-PAC PT "6 Clicks" Mobility  Outcome Measure Help needed turning from your back to your side while in a flat bed without using bedrails?: None Help needed moving from lying on your back to sitting on the side of a flat bed without  using bedrails?: None Help needed moving to and from a bed to a chair (including a wheelchair)?: A Little Help needed standing up from a chair using your arms (e.g., wheelchair or bedside chair)?: A Little Help needed to walk in hospital room?: A Little Help needed climbing 3-5 steps with a railing? : A Lot 6 Click Score: 19    End of Session   Activity Tolerance: Patient tolerated treatment well;Patient limited by fatigue Patient left: in bed;with call bell/phone within reach;with family/visitor present;with nursing/sitter in room Nurse Communication: Mobility status PT Visit Diagnosis: Unsteadiness on feet (R26.81);Other abnormalities of gait and mobility (R26.89);Muscle weakness (generalized) (M62.81)    Time: 1610-9604 PT Time Calculation (min) (ACUTE ONLY): 29 min   Charges:   PT Evaluation $PT Eval Moderate Complexity: 1 Mod PT Treatments $Therapeutic Activity: 23-37 mins        4:40 PM, 05/31/20 Lonell Grandchild, MPT Physical Therapist with [facility] [phone] office [phone] mobile phone

## 2020-06-01 LAB — CBC WITH DIFFERENTIAL/PLATELET
Abs Immature Granulocytes: 0.12 10*3/uL — ABNORMAL HIGH (ref 0.00–0.07)
Basophils Absolute: 0 10*3/uL (ref 0.0–0.1)
Basophils Relative: 0 %
Eosinophils Absolute: 0 10*3/uL (ref 0.0–0.5)
Eosinophils Relative: 0 %
HCT: 35.9 % — ABNORMAL LOW (ref 36.0–46.0)
Hemoglobin: 11.7 g/dL — ABNORMAL LOW (ref 12.0–15.0)
Immature Granulocytes: 1 %
Lymphocytes Relative: 26 %
Lymphs Abs: 2.7 10*3/uL (ref 0.7–4.0)
MCH: 29.4 pg (ref 26.0–34.0)
MCHC: 32.6 g/dL (ref 30.0–36.0)
MCV: 90.2 fL (ref 80.0–100.0)
Monocytes Absolute: 0.5 10*3/uL (ref 0.1–1.0)
Monocytes Relative: 5 %
Neutro Abs: 7 10*3/uL (ref 1.7–7.7)
Neutrophils Relative %: 68 %
Platelets: 339 10*3/uL (ref 150–400)
RBC: 3.98 MIL/uL (ref 3.87–5.11)
RDW: 12.5 % (ref 11.5–15.5)
WBC: 10.4 10*3/uL (ref 4.0–10.5)
nRBC: 0 % (ref 0.0–0.2)

## 2020-06-01 LAB — CULTURE, BLOOD (ROUTINE X 2)
Culture: NO GROWTH
Culture: NO GROWTH
Special Requests: ADEQUATE
Special Requests: ADEQUATE

## 2020-06-01 LAB — COMPREHENSIVE METABOLIC PANEL
ALT: 48 U/L — ABNORMAL HIGH (ref 0–44)
AST: 35 U/L (ref 15–41)
Albumin: 2.5 g/dL — ABNORMAL LOW (ref 3.5–5.0)
Alkaline Phosphatase: 99 U/L (ref 38–126)
Anion gap: 9 (ref 5–15)
BUN: 19 mg/dL (ref 8–23)
CO2: 26 mmol/L (ref 22–32)
Calcium: 8.8 mg/dL — ABNORMAL LOW (ref 8.9–10.3)
Chloride: 104 mmol/L (ref 98–111)
Creatinine, Ser: 0.68 mg/dL (ref 0.44–1.00)
GFR, Estimated: >60 mL/min (ref >60)
Glucose, Bld: 84 mg/dL (ref 70–99)
Potassium: 3.1 mmol/L — ABNORMAL LOW (ref 3.5–5.1)
Sodium: 139 mmol/L (ref 135–145)
Total Bilirubin: 0.5 mg/dL (ref 0.3–1.2)
Total Protein: 5.3 g/dL — ABNORMAL LOW (ref 6.5–8.1)

## 2020-06-01 LAB — B. BURGDORFI ANTIBODIES

## 2020-06-01 LAB — VANCOMYCIN, TROUGH: Vancomycin Tr: 6 ug/mL — ABNORMAL LOW (ref 15–20)

## 2020-06-01 LAB — EHRLICHIA ANTIBODY PANEL
E chaffeensis (HGE) Ab, IgG: NEGATIVE
E chaffeensis (HGE) Ab, IgM: NEGATIVE
E. Chaffeensis (HME) IgM Titer: NEGATIVE
E.Chaffeensis (HME) IgG: NEGATIVE

## 2020-06-01 LAB — MISC LABCORP TEST (SEND OUT): Labcorp test code: 164226

## 2020-06-01 MED ORDER — POTASSIUM CHLORIDE IN NACL 20-0.9 MEQ/L-% IV SOLN: INTRAVENOUS | Status: DC

## 2020-06-01 MED ORDER — DEXAMETHASONE 4 MG PO TABS
10.0000 mg | ORAL_TABLET | Freq: Two times a day (BID) | ORAL | Status: DC
  Administered 2020-06-01 – 2020-06-02 (×2): 10 mg via ORAL
  Filled 2020-06-01 (×2): qty 3

## 2020-06-01 MED ORDER — KETOROLAC TROMETHAMINE 15 MG/ML IJ SOLN
15.0000 mg | Freq: Once | INTRAMUSCULAR | Status: AC
  Administered 2020-06-01: 15 mg via INTRAVENOUS
  Filled 2020-06-01: qty 1

## 2020-06-01 MED ORDER — SODIUM CHLORIDE 0.9 % IV BOLUS
1000.0000 mL | Freq: Once | INTRAVENOUS | Status: AC
  Administered 2020-06-01: 1000 mL via INTRAVENOUS

## 2020-06-01 MED ORDER — HYDROCODONE-ACETAMINOPHEN 5-325 MG PO TABS
1.0000 | ORAL_TABLET | Freq: Four times a day (QID) | ORAL | Status: DC | PRN
  Administered 2020-06-03: 1 via ORAL
  Filled 2020-06-01: qty 1

## 2020-06-01 MED ORDER — BUTALBITAL-APAP-CAFFEINE 50-325-40 MG PO TABS: 2.0000 | ORAL_TABLET | ORAL | Status: DC | PRN

## 2020-06-01 MED ORDER — POTASSIUM CHLORIDE 10 MEQ/100ML IV SOLN
10.0000 meq | INTRAVENOUS | Status: AC
  Administered 2020-06-01 (×3): 10 meq via INTRAVENOUS
  Filled 2020-06-01 (×3): qty 100

## 2020-06-01 MED ORDER — VANCOMYCIN HCL IN DEXTROSE 1-5 GM/200ML-% IV SOLN
1000.0000 mg | Freq: Two times a day (BID) | INTRAVENOUS | Status: DC
  Administered 2020-06-01: 1000 mg via INTRAVENOUS
  Filled 2020-06-01: qty 200

## 2020-06-01 MED ORDER — VANCOMYCIN HCL IN DEXTROSE 1-5 GM/200ML-% IV SOLN
1000.0000 mg | Freq: Two times a day (BID) | INTRAVENOUS | Status: DC
  Administered 2020-06-02 (×2): 1000 mg via INTRAVENOUS
  Filled 2020-06-01 (×2): qty 200

## 2020-06-01 NOTE — Progress Notes (Signed)
Pharmacy Antibiotic Note  Angela Mullins is a 65 y.o. female admitted on 05/27/2020 with sepsis/meningitis.  Pharmacy has been consulted for Ceftriaxone, vancomycin dosing. F/U Vancomycin levels on 5/19   Plan: Continue Vancomycin 1000mg  IV q24h for AUC 448 Continue Ceftriaxone 2gm IV q12h F/U cxs and clinical progress Monitor V/S, labs  Height: 5' 3.75" (161.9 cm) Weight: 58.1 kg (128 lb) IBW/kg (Calculated) : 54.13  Temp (24hrs), Avg:98.1 F (36.7 C), Min:97.9 F (36.6 C), Max:98.3 F (36.8 C)  Recent Labs  Lab 05/27/20 1921 05/27/20 1933 05/27/20 2239 05/28/20 0438 05/30/20 0524 05/31/20 0554 06/01/20 0630  WBC 19.3*  --   --  14.8* 6.3  --  10.4  CREATININE 0.88 0.80  --  0.66 0.58 0.53 0.68  LATICACIDVEN 1.3  --  0.8  --   --   --   --     Estimated Creatinine Clearance: 61.5 mL/min (by C-G formula based on SCr of 0.68 mg/dL).    Allergies  Allergen Reactions  . Demerol [Meperidine Hcl] Other (See Comments)    Does not help  . Quinine Derivatives Nausea And Vomiting    Antimicrobials this admission:  cefepime 5/14 >> 5/16 Ceftriaxone 5/16>> Vancomycin 5/14 >> 5/15 restarted 5/16>> 5/14 metronidazole >>5/16 Doxycycline 5/16>> Acyclovir 5/16>>5/19  Microbiology results: 5/14 BCx: ngtd 5/15 UCx: no growth 5/14 Resp Panel: negative 5/15 MRSA PCR is negative 5/16 CSF c: no growth to date 5/16 Crypto Ag is negative 5/16 HSV negative lyme and RMSF serology  Thank you for allowing pharmacy to be a part of this patient's care.  Isac Sarna, BS Pharm D, [place] Clinical Pharmacist Pager [phone] 06/01/2020 11:05 AM

## 2020-06-01 NOTE — Progress Notes (Signed)
PROGRESS NOTE  Angela Mullins [id number] DOB: May 01, 1955 DOA: 05/27/2020 PCP: Coolidge Breeze, FNP   Brief History:  65 y.o.femalewith medical history significant ofanxiety, GERD, hyperlipidemia, hypertension, vitamin D deficiency who is coming to the emergency department due to headache, similar to her migraines, but more intense than usual that has not responded to Relpax for the past 48 hours associated with nausea, vomiting and generalized weakness. Her husband also stated in the ED that her mentation has been decreased. She had a colonoscopy on Tuesday with some polypectomies, but denies abdominal pain, diarrhea, constipation, melena hematochezia.. Wednesday was uneventful, but then developed symptoms of Thursday. EMS was called, but subsequently they decided not to come to the ED. No history of fever at home, but was febrile at 101 F in the emergency department. No sore throat, cough, dyspnea, wheezing or hemoptysis. No chest pain, palpitations, lower extremity edema. No dysuria, frequency or hematuria.  ED Course:Initial vital signs were temperature98.5 F, pulse 99, respirations 17, BP 170/93 mmHg O2 sat 98% on room air. The patient received acetaminophen 650 mg p.o., 2000 mL of LR bolus, Zofran 4 mg IVP, hydromorphone 1 mg IVP x2, cefepime, metronidazole and vancomycin  Assessment/Plan: Aseptic Meningitis -cannot completely r/o septic meningitis as patient had about 2 days abx prior to LP -CSF cultures remain neg -cryptococcus antigen neg -HSV PCR--neg -continue empiric vanco and ceftriaxone -Discontinue acyclovir -d/c dexamethasone -05/29/20 LP showed WBC 318, 69 RBC, gluc 29, pro 97 -await lyme and RMSF serology-->NEG -add Ehrlichia serology -[id number] headache slight worse-->toradol, bolus IVF, norco  Acute respiratory failure with hypoxia -initially placed on 4L>>2L -due to COPD exacerbation -start duoneb -start  pulmicort  Hyperlipidemia -continue statin and fenofibrate  HTN -continue lisinopril  GERD -continue PPI  Hyperglycemia -check A1C--5.7  Insect bite -pt endorses insect bite about 2 weeks prior to admit -await serologies--Lyme and RMSF--neg -Ehrlichia--pending  Hypokalemia -repleted      Status is: Inpatient  Remains inpatient appropriate because:IV treatments appropriate due to intensity of illness or inability to take PO   Dispo: The patient is from: Home  Anticipated d/c is to: Home  Patient currently is not medically stable to d/c.              Difficult to place patient No    Total time spent 35 minutes.  Greater than 50% spent face to face counseling and coordinating care.     Family Communication: son and spouse updated 5/19  Consultants:  neurology  Code Status:  FULL   DVT Prophylaxis:  SCDs   Procedures: As Listed in Progress Note Above  Antibiotics: Acyclovir 5/16>>5/19 Ceftriaxone 5/16>> Cefepime 5/14>>5/16 vanc 5/14>>    Subjective: Patient complains of frontal headache worse today.  Denies f/c, focal extremity weakness, visual disturbance. N/v/d, dysarthria  Objective: Vitals:   05/31/20 1849 05/31/20 2116 06/01/20 0343 06/01/20 1245  BP:  97/85 (!) 147/83 128/76  Pulse: 77 66 65 72  Resp:  16 16 18   Temp:  98.2 F (36.8 C) 97.9 F (36.6 C) 98.4 F (36.9 C)  TempSrc:  Oral Oral Oral  SpO2: 99% 96% 96% 97%  Weight:      Height:        Intake/Output Summary (Last 24 hours) at 06/01/2020 1331 Last data filed at 06/01/2020 0900 Gross per 24 hour  Intake 722.08 ml  Output 650 ml  Net 72.08 ml   Weight change:  Exam:   General:  Pt  is alert, follows commands appropriately, not in acute distress  HEENT: No icterus, No thrush, No neck mass, [place]/AT  Cardiovascular: RRR, S1/S2, no rubs, no gallops  Respiratory: diminished BS, bibasilar rales. No wheeze  Abdomen:  Soft/+BS, non tender, non distended, no guarding  Extremities: No edema, No lymphangitis, No petechiae, No rashes, no synovitis  Neuro:  CN II-XII intact, strength 4/5 in RUE, RLE, strength 4/5 LUE, LLE; sensation intact bilateral; no dysmetria; babinski equivocal     Data Reviewed: I have personally reviewed following labs and imaging studies Basic Metabolic Panel: Recent Labs  Lab 05/27/20 1921 05/27/20 1933 05/28/20 0438 05/30/20 0524 05/31/20 0554 06/01/20 0630  NA 140 143 139 141 142 139  K 3.7 3.8 3.8 3.3* 3.8 3.1*  CL 106 106 105 107 107 104  CO2 22  --  26 26 26 26   GLUCOSE 148* 148* 127* 94 147* 84  BUN 16 15 12 19 13 19   CREATININE 0.88 0.80 0.66 0.58 0.53 0.68  CALCIUM 10.1  --  8.8* 8.6* 9.0 8.8*   Liver Function Tests: Recent Labs  Lab 05/27/20 1921 05/28/20 0438 05/30/20 0524 06/01/20 0630  AST 28 45* 26 35  ALT 49* 66* 49* 48*  ALKPHOS 112 151* 121 99  BILITOT 0.9 0.7 0.5 0.5  PROT 8.3* 5.8* 5.4* 5.3*  ALBUMIN 4.2 2.8* 2.4* 2.5*   Recent Labs  Lab 05/27/20 1921  LIPASE 19   No results for input(s): AMMONIA in the last 168 hours. Coagulation Profile: Recent Labs  Lab 05/27/20 1921  INR 1.1   CBC: Recent Labs  Lab 05/27/20 1921 05/27/20 1933 05/28/20 0438 05/30/20 0524 06/01/20 0630  WBC 19.3*  --  14.8* 6.3 10.4  NEUTROABS 17.4*  --  12.6*  --  7.0  HGB 15.1* 15.6* 11.9* 12.0 11.7*  HCT 45.7 46.0 37.2 35.8* 35.9*  MCV 89.6  --  92.1 88.8 90.2  PLT 273  --  228 224 339   Cardiac Enzymes: No results for input(s): CKTOTAL, CKMB, CKMBINDEX, TROPONINI in the last 168 hours. BNP: Invalid input(s): POCBNP CBG: Recent Labs  Lab 05/27/20 1934  GLUCAP 136*   HbA1C: Recent Labs    05/31/20 1343  HGBA1C 5.7*   Urine analysis:    Component Value Date/Time   COLORURINE YELLOW 05/28/2020 1209   APPEARANCEUR CLEAR 05/28/2020 1209   LABSPEC 1.025 05/28/2020 1209   PHURINE 5.0 05/28/2020 1209   GLUCOSEU 50 (A) 05/28/2020 1209    HGBUR NEGATIVE 05/28/2020 [place] 05/28/2020 1209   KETONESUR 5 (A) 05/28/2020 1209   PROTEINUR NEGATIVE 05/28/2020 1209   NITRITE NEGATIVE 05/28/2020 1209   LEUKOCYTESUR NEGATIVE 05/28/2020 1209   Sepsis Labs: @LABRCNTIP (procalcitonin:4,lacticidven:4) ) Recent Results (from the past 240 hour(s))  Blood Culture (routine x 2)     Status: None   Collection Time: 05/27/20  7:55 PM   Specimen: BLOOD  Result Value Ref Range Status   Specimen Description BLOOD BLOOD RIGHT FOREARM  Final   Special Requests   Final    Blood Culture adequate volume BOTTLES DRAWN AEROBIC AND ANAEROBIC   Culture   Final    NO GROWTH 5 DAYS Performed at [facility], [address] [place], [place] [address]    Report Status 06/01/2020 FINAL  Final  Blood Culture (routine x 2)     Status: None   Collection Time: 05/27/20  8:00 PM   Specimen: BLOOD  Result Value Ref Range Status   Specimen Description BLOOD  BLOOD LEFT ARM  Final   Special Requests   Final    Blood Culture adequate volume BOTTLES DRAWN AEROBIC AND ANAEROBIC   Culture   Final    NO GROWTH 5 DAYS Performed at [facility], [address], [place], [place] [address]    Report Status 06/01/2020 FINAL  Final  Resp Panel by RT-PCR (Flu A&B, Covid) Nasopharyngeal Swab     Status: None   Collection Time: 05/27/20  8:26 PM   Specimen: Nasopharyngeal Swab; Nasopharyngeal(NP) swabs in vial transport medium  Result Value Ref Range Status   SARS Coronavirus 2 by RT PCR NEGATIVE NEGATIVE Final    Comment: (NOTE) SARS-CoV-2 target nucleic acids are NOT DETECTED.  The SARS-CoV-2 RNA is generally detectable in upper respiratory specimens during the acute phase of infection. The lowest concentration of SARS-CoV-2 viral copies this assay can detect is 138 copies/mL. A negative result does not preclude SARS-Cov-2 infection and should not be used as the sole basis for treatment or other patient management decisions. A negative result  may occur with  improper specimen collection/handling, submission of specimen other than nasopharyngeal swab, presence of viral mutation(s) within the areas targeted by this assay, and inadequate number of viral copies(<138 copies/mL). A negative result must be combined with clinical observations, patient history, and epidemiological information. The expected result is Negative.  Fact Sheet for Patients:  EntrepreneurPulse.com.au  Fact Sheet for Healthcare Providers:  IncredibleEmployment.be  This test is no t yet approved or cleared by the [place] FDA and  has been authorized for detection and/or diagnosis of SARS-CoV-2 by FDA under an Emergency Use Authorization (EUA). This EUA will remain  in effect (meaning this test can be used) for the duration of the COVID-19 declaration under Section 564(b)(1) of the Act, 21 U.S.C.section 360bbb-3(b)(1), unless the authorization is terminated  or revoked sooner.       Influenza A by PCR NEGATIVE NEGATIVE Final   Influenza B by PCR NEGATIVE NEGATIVE Final    Comment: (NOTE) The Xpert Xpress SARS-CoV-2/FLU/RSV plus assay is intended as an aid in the diagnosis of influenza from Nasopharyngeal swab specimens and should not be used as a sole basis for treatment. Nasal washings and aspirates are unacceptable for Xpert Xpress SARS-CoV-2/FLU/RSV testing.  Fact Sheet for Patients: EntrepreneurPulse.com.au  Fact Sheet for Healthcare Providers: IncredibleEmployment.be  This test is not yet approved or cleared by the [place] FDA and has been authorized for detection and/or diagnosis of SARS-CoV-2 by FDA under an Emergency Use Authorization (EUA). This EUA will remain in effect (meaning this test can be used) for the duration of the COVID-19 declaration under Section 564(b)(1) of the Act, 21 U.S.C. section 360bbb-3(b)(1), unless the authorization is terminated  or revoked.  Performed at [facility], [address] [place], [place] [address]   MRSA PCR Screening     Status: None   Collection Time: 05/28/20  9:01 AM   Specimen: Nasopharyngeal  Result Value Ref Range Status   MRSA by PCR NEGATIVE NEGATIVE Final    Comment:        The GeneXpert MRSA Assay (FDA approved for NASAL specimens only), is one component of a comprehensive MRSA colonization surveillance program. It is not intended to diagnose MRSA infection nor to guide or monitor treatment for MRSA infections. Performed at [facility], [address], [place], [place] [address]   Urine culture     Status: None   Collection Time: 05/28/20 12:09 PM   Specimen: Urine, Catheterized  Result Value Ref Range Status   Specimen Description   Final    URINE, CATHETERIZED Performed at [facility], [address], [place], [place] [address]    Special Requests   Final    NONE Performed at [facility], [address], [place], [place] [address]    Culture   Final    NO GROWTH Performed at [facility], [place] [address] [place], [place] [address]    Report Status 05/30/2020 FINAL  Final  CSF culture w Stat Gram Stain     Status: None (Preliminary result)   Collection Time: 05/29/20  3:30 PM   Specimen: CSF; Cerebrospinal Fluid  Result Value Ref Range Status   Specimen Description   Final    CSF Performed at [facility], [address] [place], [place] [address]    Special Requests   Final    NONE Performed at [facility], [address], [place], [place] [address]    Gram Stain   Final    NO ORGANISMS SEEN WBC PRESENT,BOTH PMN AND MONONUCLEAR Gram Stain Report Called to,Read Back By and Verified With: COLEMAN L. AT [id number] ON [id number] BY THOMPSON S. Performed at [facility], [address], [place], [place] [address]    Culture   Final    NO GROWTH 3 DAYS Performed at [facility] [address] [place], [place] [address]    Report Status PENDING  Incomplete      Scheduled Meds: . aspirin EC  81 mg Oral Daily  . atorvastatin  80 mg Oral Daily  . doxycycline  100 mg Oral Q12H  . fenofibrate  160 mg Oral Daily  . lisinopril  10 mg Oral Daily  . loratadine  10 mg Oral Daily  . pantoprazole  40 mg Oral Daily   Continuous Infusions: . sodium chloride 50 mL/hr at 05/31/20 1836  . cefTRIAXone (ROCEPHIN)  IV 2 g (06/01/20 0618)  . vancomycin 1,000 mg (05/31/20 1840)    Procedures/Studies: CT Abdomen Pelvis Wo Contrast  Result Date: 05/27/2020 CLINICAL DATA:  Abdominal pain and fever EXAM: CT ABDOMEN AND PELVIS WITHOUT CONTRAST TECHNIQUE: Multidetector CT imaging of the abdomen and pelvis was performed following the standard protocol without IV contrast. COMPARISON:  CT February 17, 2008 FINDINGS: Lower chest: Bibasilar atelectasis/scarring. Normal size heart. No significant pericardial effusion/thickening. Small hiatal hernia. Hepatobiliary: Unremarkable noncontrast appearance of the hepatic parenchyma. Gallbladder is surgically absent. No biliary ductal dilation. Pancreas: Mild retroperitoneal/peripancreatic fat stranding. No pancreatic ductal dilation. Spleen: Within normal limits Adrenals/Urinary Tract: Mild apparent thickening of the the bilateral adrenal glands with adjacent inflammatory stranding. No hydronephrosis. No nephrolithiasis. No contour deforming renal masses. The urinary bladder is grossly unremarkable for degree of distension. Stomach/Bowel: Small hiatal hernia otherwise the stomach is grossly unremarkable for degree of distension. No pathologic dilation of small bowel. Tiny appendicoliths in a non appendix. No suspicious colonic wall thickening or mass like lesions. Scattered colonic diverticulosis without findings of acute diverticulitis. Vascular/Lymphatic: Aortic atherosclerosis. No enlarged abdominal or pelvic lymph nodes. Reproductive: Uterus and bilateral adnexa are unremarkable. Other: No pneumoperitoneum. Musculoskeletal: Multilevel  degenerative changes spine. No acute osseous abnormality. IMPRESSION: 1. Mild retroperitoneal/peripancreatic fat stranding nonspecific but possibly representing acute pancreatitis. Correlation with serum lipase is recommended. 2. Mild apparent thickening of the bilateral adrenal glands with adjacent inflammatory stranding, which is nonspecific but may represent adrenal congestion. 3. Colonic diverticulosis without findings of acute diverticulitis. 4. Small hiatal hernia. 5. Aortic atherosclerosis. Aortic Atherosclerosis (ICD10-I70.0).  Electronically Signed   By: Dahlia Bailiff MD   On: 05/27/2020 22:01   CT Angio Head W or Wo Contrast  Result Date: 05/28/2020 CLINICAL DATA:  Initial evaluation for acute headache. EXAM: CT ANGIOGRAPHY HEAD AND NECK CT VENOGRAM OF THE HEAD WITH CONTRAST. TECHNIQUE: Multidetector CT imaging of the head and neck was performed using the standard protocol during bolus administration of intravenous contrast. Multiplanar CT image reconstructions and MIPs were obtained to evaluate the vascular anatomy. Carotid stenosis measurements (when applicable) are obtained utilizing NASCET criteria, using the distal internal carotid diameter as the denominator. CONTRAST:  57mL OMNIPAQUE IOHEXOL 350 MG/ML SOLN COMPARISON:  Prior noncontrast CT from 05/27/2020. FINDINGS: CTA NECK FINDINGS Aortic arch: Visualized aortic arch normal caliber with normal branch pattern. Mild atheromatous change about the arch and origin of the great vessels without hemodynamically significant stenosis. Right carotid system: Right common and internal carotid arteries widely patent without stenosis, dissection or occlusion. Minimal atheromatous plaque about the right bifurcation without stenosis. Left carotid system: Left CCA patent from its origin to the bifurcation without stenosis. 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 Left ICA patent distally  without stenosis, dissection or occlusion. Vertebral arteries: Both vertebral arteries arise from the subclavian arteries. No proximal subclavian artery stenosis. Vertebral arteries patent within the neck without stenosis, dissection or occlusion. Skeleton: No visible acute osseous finding. No discrete or worrisome osseous lesions. Other neck: No other acute soft tissue abnormality within the neck. No mass or adenopathy. Upper chest: Advanced emphysema. Superimposed scattered patchy opacities within the posterior left greater than right lungs, could reflect atelectasis or infiltrates. Review of the MIP images confirms the above findings CTA HEAD FINDINGS Anterior circulation: Petrous segments widely patent bilaterally. Minimal atheromatous change within the carotid siphons without significant stenosis or other abnormality. A1 segments widely patent. Normal anterior communicating artery complex. Anterior cerebral arteries patent to their distal aspects without stenosis. No M1 stenosis or occlusion. Right M1 bifurcates early. Bifurcations M cells within normal limits. Distal MCA branches well perfused and symmetric. Posterior circulation: Both V4 segments patent to the vertebrobasilar junction without stenosis. Both PICA origins patent and normal. Basilar patent to its distal aspect without stenosis. Superior cerebellar arteries patent bilaterally. Both PCAs primarily supplied via the basilar well perfused or distal aspects. Small right posterior communicating artery noted. Venous sinuses: Normal enhancement seen throughout the superior sagittal sinus to the torcula. Torcula itself appears patent. Transverse and sigmoid sinuses are patent as are the visualized proximal internal jugular veins. Straight sinus, vein of Galen, internal cerebral veins, and basal veins of Rosenthal appear patent. No evidence for dural sinus thrombosis. Cavernous sinus appears patent. Superior orbital veins symmetric and within normal  limits. Anatomic variants: None significant. No aneurysm or other vascular abnormality. Review of the MIP images confirms the above findings IMPRESSION: 1. Negative CTA for large vessel occlusion. No high-grade or correctable stenosis within the major arterial vasculature of the head and neck. No aneurysm. 2. 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 3. Negative CT venogram.  No evidence for dural sinus thrombosis. 4. Emphysema (ICD10-J43.9). Superimposed patchy opacities within the posterior left greater than right lungs could reflect atelectasis or infiltrates. Correlation with physical exam and any potential symptomatology recommended. Electronically Signed   By: Jeannine Boga M.D.   On: 05/28/2020 04:07   CT HEAD WO CONTRAST  Result Date:  05/27/2020 CLINICAL DATA:  Headache.  Dysarthria EXAM: CT HEAD WITHOUT CONTRAST TECHNIQUE: Contiguous axial images were obtained from the base of the skull through the vertex without intravenous contrast. COMPARISON:  None. FINDINGS: Brain: Ventricles and sulci are normal in size and configuration. There is no intracranial mass, hemorrhage, extra-axial fluid collection, or midline shift. Brain parenchyma appears unremarkable. No evident acute infarct. Vascular: No hyperdense vessel.  No evident vascular calcification. Skull: Bony calvarium appears intact. Sinuses/Orbits: There are small retention cysts in each inferior maxillary antrum. There is a small retention cyst in a posterior right ethmoid air cell. There is rightward deviation of the nasal septum. Orbits appear symmetric bilaterally. Other: Mastoid air cells are clear. There is debris in the right external auditory canal. IMPRESSION: Normal appearing brain parenchyma. No acute infarct. No mass or hemorrhage. Foci of mild paranasal sinus disease. Deviated nasal septum. Probable cerumen in the right external auditory canal. Electronically Signed    By: Lowella Grip III M.D.   On: 05/27/2020 20:00   CT Angio Neck W and/or Wo Contrast  Result Date: 05/28/2020 CLINICAL DATA:  Initial evaluation for acute headache. EXAM: CT ANGIOGRAPHY HEAD AND NECK CT VENOGRAM OF THE HEAD WITH CONTRAST. TECHNIQUE: Multidetector CT imaging of the head and neck was performed using the standard protocol during bolus administration of intravenous contrast. Multiplanar CT image reconstructions and MIPs were obtained to evaluate the vascular anatomy. Carotid stenosis measurements (when applicable) are obtained utilizing NASCET criteria, using the distal internal carotid diameter as the denominator. CONTRAST:  59mL OMNIPAQUE IOHEXOL 350 MG/ML SOLN COMPARISON:  Prior noncontrast CT from 05/27/2020. FINDINGS: CTA NECK FINDINGS Aortic arch: Visualized aortic arch normal caliber with normal branch pattern. Mild atheromatous change about the arch and origin of the great vessels without hemodynamically significant stenosis. Right carotid system: Right common and internal carotid arteries widely patent without stenosis, dissection or occlusion. Minimal atheromatous plaque about the right bifurcation without stenosis. Left carotid system: Left CCA patent from its origin to the bifurcation without stenosis. 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 Left ICA patent distally without stenosis, dissection or occlusion. Vertebral arteries: Both vertebral arteries arise from the subclavian arteries. No proximal subclavian artery stenosis. Vertebral arteries patent within the neck without stenosis, dissection or occlusion. Skeleton: No visible acute osseous finding. No discrete or worrisome osseous lesions. Other neck: No other acute soft tissue abnormality within the neck. No mass or adenopathy. Upper chest: Advanced emphysema. Superimposed scattered patchy opacities within the posterior left greater than right lungs, could  reflect atelectasis or infiltrates. Review of the MIP images confirms the above findings CTA HEAD FINDINGS Anterior circulation: Petrous segments widely patent bilaterally. Minimal atheromatous change within the carotid siphons without significant stenosis or other abnormality. A1 segments widely patent. Normal anterior communicating artery complex. Anterior cerebral arteries patent to their distal aspects without stenosis. No M1 stenosis or occlusion. Right M1 bifurcates early. Bifurcations M cells within normal limits. Distal MCA branches well perfused and symmetric. Posterior circulation: Both V4 segments patent to the vertebrobasilar junction without stenosis. Both PICA origins patent and normal. Basilar patent to its distal aspect without stenosis. Superior cerebellar arteries patent bilaterally. Both PCAs primarily supplied via the basilar well perfused or distal aspects. Small right posterior communicating artery noted. Venous sinuses: Normal enhancement seen throughout the superior sagittal sinus to the torcula. Torcula itself appears patent. Transverse and sigmoid sinuses are patent as are the visualized proximal internal jugular  veins. Straight sinus, vein of Galen, internal cerebral veins, and basal veins of Rosenthal appear patent. No evidence for dural sinus thrombosis. Cavernous sinus appears patent. Superior orbital veins symmetric and within normal limits. Anatomic variants: None significant. No aneurysm or other vascular abnormality. Review of the MIP images confirms the above findings IMPRESSION: 1. Negative CTA for large vessel occlusion. No high-grade or correctable stenosis within the major arterial vasculature of the head and neck. No aneurysm. 2. 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 3. Negative CT venogram.  No evidence for dural sinus thrombosis. 4. Emphysema (ICD10-J43.9). Superimposed patchy opacities within the  posterior left greater than right lungs could reflect atelectasis or infiltrates. Correlation with physical exam and any potential symptomatology recommended. Electronically Signed   By: Jeannine Boga M.D.   On: 05/28/2020 04:07   MR BRAIN W WO CONTRAST  Result Date: 05/31/2020 CLINICAL DATA:  Sarcoidosis; encephalitis. EXAM: MRI HEAD WITHOUT AND WITH CONTRAST TECHNIQUE: Multiplanar, multiecho pulse sequences of the brain and surrounding structures were obtained without and with intravenous contrast. CONTRAST:  34mL GADAVIST GADOBUTROL 1 MMOL/ML IV SOLN COMPARISON:  Head CT May 27, 2020 FINDINGS: Brain: No acute infarction, hemorrhage, hydrocephalus, extra-axial collection or mass lesion. Rare foci of T2 hyperintensity are seen within white matter of the cerebral hemispheres, nonspecific. No focus of abnormal contrast enhancement Vascular: Normal flow voids. Skull and upper cervical spine: Normal marrow signal. Sinuses/Orbits: Small mucous retention cyst in the bilateral maxillary sinuses. The orbits maintained. Other: Mild mucosal thickening of the right mastoid cells. IMPRESSION: Minimal amount of nonspecific T2 hyperintense lesions the white matter, may represent early microvascular ischemic changes. No focus of abnormal contrast enhancement. Electronically Signed   By: Pedro Earls M.D.   On: 05/31/2020 10:39   DG Chest Port 1 View  Result Date: 05/27/2020 CLINICAL DATA:  Questionable sepsis - evaluate for abnormality EXAM: PORTABLE CHEST 1 VIEW COMPARISON:  Radiograph 01/28/2006 FINDINGS: The cardiomediastinal contours are normal. Chronic blunting of left costophrenic angle. Mild interstitial coarsening. Pulmonary vasculature is normal. No consolidation, pleural effusion, or pneumothorax. No acute osseous abnormalities are seen. IMPRESSION: 1. Mild interstitial coarsening of unknown acuity, likely chronic. 2. Chronic blunting of the left costophrenic angle. Electronically Signed    By: Keith Rake M.D.   On: 05/27/2020 20:41   DG Carlena Hurl SPEECH PATH  Result Date: 05/18/2020 [facility] [address] [place], [place], [address] Phone: [phone]   Fax:  [phone] Modified Barium Swallow Patient Details Name: Etta Taibi MRN: [id number] Date of Birth: 12-May-1955 No data recorded Encounter Date: 05/18/2020  End of Session - 05/18/20 1254   Visit Number 1   Number of Visits 1   Authorization Type [employer]   SLP Start Time [id number]   SLP Stop Time  1215   SLP Time Calculation (min) 30 min   Activity Tolerance Patient tolerated treatment well     Past Medical History: Diagnosis Date . Anxiety  . GERD (gastroesophageal reflux disease)  . Hypercholesteremia  . Hypertension  . Vitamin D deficiency  Past Surgical History: Procedure Laterality Date . CHOLECYSTECTOMY   . PELVIC FRACTURE SURGERY   . WISDOM TOOTH EXTRACTION   There were no vitals filed for this visit.  Subjective Assessment - 05/18/20 1247   Subjective "I have to throw my head back to swallow pills."   Special Tests MBSS   Currently in Pain? No/denies  General - 05/18/20 1248    General Information  Date of Onset 05/10/20   HPI Shinita Mac is a 65 yo female who was referred by Dr. Maylon Peppers for MBSS due to Pt with reports of dysphagia. Pt with PMH significant for anxiety, HTN, HLD, and GERD. Patient reports that since she was a teenager she has felt recurrent episodes of dysphagia when swallowing pills, very occasionally when swallowing solid food.  She reported that she tries to chew very well reported to avoid having any choking episodes.  However, she reported that she feels "strangled" when food goes occasionally as if there was some sort of pressure in her throat.  Does not have any symptoms when swallowing liquids.  More recently, for the last year she has presented episodes of feeling a foreign body in her throat as "if something was lodged in her throat".  She constantly  clears her throat but reports no phlegm comes out. She quit smoking last year. Her Prilosec was increased to 40 mg twice per day last week. She does not sleep with the HOB elevated, but uses a wedge. No recent reports of PNA.   Type of Study MBS-Modified Barium Swallow Study   Previous Swallow Assessment None on record   Diet Prior to this Study Regular;Thin liquids   Temperature Spikes Noted No   Respiratory Status Room air   History of Recent Intubation No   Behavior/Cognition Alert;Cooperative;Pleasant mood   Oral Cavity Assessment Within Functional Limits   Oral Care Completed by SLP No   Oral Cavity - Dentition Adequate natural dentition   Vision Functional for self feeding   Self-Feeding Abilities Able to feed self   Patient Positioning Upright in chair   Baseline Vocal Quality Normal   Volitional Cough Strong   Volitional Swallow Able to elicit   Anatomy Within functional limits   Pharyngeal Secretions Not observed secondary MBS      Oral Preparation/Oral Phase - 05/18/20 1248    Oral Preparation/Oral Phase  Oral Phase Impaired    Oral - Thin  Oral - Thin Teaspoon Within functional limits   Oral - Thin Cup Within functional limits    Oral - Solids  Oral - Puree Within functional limits   Oral - Regular Within functional limits   Oral - Pill Within functional limits    Electrical stimulation - Oral Phase  Was Electrical Stimulation Used No      Pharyngeal Phase - 05/18/20 1250    Pharyngeal Phase  Pharyngeal Phase Within functional limits      Cricopharyngeal Phase - 05/18/20 1250    Cervical Esophageal Phase  Cervical Esophageal Phase Impaired    Cervical Esophageal Phase - Comment  Cervical Esophageal Comment Pt points to ~C7 as source of globus with solids and pill   Other Esophageal Phase Observations Pt with delayed esophageal transit, incomplete clearance with primary wave, stasis of pill near LES which cleared with puree wash (did not clear with thin wash)      Plan - 05/18/20 1254   Clinical Impression  Statement MBSS completed. Pt assessed with barium tinged thin, puree, regular textures, and barium tablet. Oropharyngeal swallow is WNL with the exception of Pt producing effortful swallows with solids, however suspect this is due to esophageal component. Swallow initiation was timely and hyolaryngeal excursion WNL, no penetration/aspiration or significant pharyngeal residuals post swallow. Esophageal sweep completed before and after presentation of barium table revealed incomplete clearance of barium via primary wave. The barium tablet became  transiently delayed near the LES and was not cleared by liquid wash, but was cleared with a bite of puree. Pt reports that she typically takes larger pills balled up into small pieces of bread to facilitate swallow. Pt's reports of globus, effortful swallows, non productive coughing, and regurgitation appear more consistent with esophageal dysphagia. She was recently increased to prilosec 2x per day. Pt was encouraged to alternate solids and liquids, adhere to reflux precautions, and elevate the head of her bed. No further SLP services indicated at this time. Pt has EGD scheduled for Tuesday with Dr. Jenetta Downer.   Consulted and Agree with Plan of Care Patient     Patient will benefit from skilled therapeutic intervention in order to improve the following deficits and impairments:  Dysphagia, oropharyngeal phase  Recommendations/Treatment - 05/18/20 1252    Swallow Evaluation Recommendations  Recommended Consults Consider esophageal assessment   SLP Diet Recommendations Age appropriate regular;Thin   Liquid Administration via Cup   Medication Administration Whole meds with liquid   Supervision Patient able to self feed   Compensations Follow solids with liquid   Postural Changes Seated upright at 90 degrees;Remain upright for at least 30 minutes after feeds/meals      Prognosis - 05/18/20 1253    Prognosis  Prognosis for Safe Diet Advancement Good   Barriers/Prognosis Comment  suspected primary  esophageal dysphagia    Individuals Consulted  Consulted and Agree with Results and Recommendations Patient   Report Sent to  Referring physician     Problem List Patient Active Problem List  Diagnosis Date Noted . Dysphagia 05/10/2020 . Throat discomfort 05/10/2020 . GERD (gastroesophageal reflux disease) 05/10/2020 Thank you, Genene Churn, Ricketts Spurgeon 05/18/2020, 1:01 PM [facility] [address] [place], [place], [address] Phone: [phone]   Fax:  [phone] Name: Kylenn Kuchta MRN: [id number] Date of Birth: October 13, 1955  CT VENOGRAM HEAD  Result Date: 05/28/2020 CLINICAL DATA:  Initial evaluation for acute headache. EXAM: CT ANGIOGRAPHY HEAD AND NECK CT VENOGRAM OF THE HEAD WITH CONTRAST. TECHNIQUE: Multidetector CT imaging of the head and neck was performed using the standard protocol during bolus administration of intravenous contrast. Multiplanar CT image reconstructions and MIPs were obtained to evaluate the vascular anatomy. Carotid stenosis measurements (when applicable) are obtained utilizing NASCET criteria, using the distal internal carotid diameter as the denominator. CONTRAST:  10mL OMNIPAQUE IOHEXOL 350 MG/ML SOLN COMPARISON:  Prior noncontrast CT from 05/27/2020. FINDINGS: CTA NECK FINDINGS Aortic arch: Visualized aortic arch normal caliber with normal branch pattern. Mild atheromatous change about the arch and origin of the great vessels without hemodynamically significant stenosis. Right carotid system: Right common and internal carotid arteries widely patent without stenosis, dissection or occlusion. Minimal atheromatous plaque about the right bifurcation without stenosis. Left carotid system: Left CCA patent from its origin to the bifurcation without stenosis. 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 Left ICA patent distally without stenosis,  dissection or occlusion. Vertebral arteries: Both vertebral arteries arise from the subclavian arteries. No proximal subclavian artery stenosis. Vertebral arteries patent within the neck without stenosis, dissection or occlusion. Skeleton: No visible acute osseous finding. No discrete or worrisome osseous lesions. Other neck: No other acute soft tissue abnormality within the neck. No mass or adenopathy. Upper chest: Advanced emphysema. Superimposed scattered patchy opacities within the posterior left greater than right lungs, could reflect atelectasis or infiltrates. Review of the MIP images confirms the  above findings CTA HEAD FINDINGS Anterior circulation: Petrous segments widely patent bilaterally. Minimal atheromatous change within the carotid siphons without significant stenosis or other abnormality. A1 segments widely patent. Normal anterior communicating artery complex. Anterior cerebral arteries patent to their distal aspects without stenosis. No M1 stenosis or occlusion. Right M1 bifurcates early. Bifurcations M cells within normal limits. Distal MCA branches well perfused and symmetric. Posterior circulation: Both V4 segments patent to the vertebrobasilar junction without stenosis. Both PICA origins patent and normal. Basilar patent to its distal aspect without stenosis. Superior cerebellar arteries patent bilaterally. Both PCAs primarily supplied via the basilar well perfused or distal aspects. Small right posterior communicating artery noted. Venous sinuses: Normal enhancement seen throughout the superior sagittal sinus to the torcula. Torcula itself appears patent. Transverse and sigmoid sinuses are patent as are the visualized proximal internal jugular veins. Straight sinus, vein of Galen, internal cerebral veins, and basal veins of Rosenthal appear patent. No evidence for dural sinus thrombosis. Cavernous sinus appears patent. Superior orbital veins symmetric and within normal limits. Anatomic  variants: None significant. No aneurysm or other vascular abnormality. Review of the MIP images confirms the above findings IMPRESSION: 1. Negative CTA for large vessel occlusion. No high-grade or correctable stenosis within the major arterial vasculature of the head and neck. No aneurysm. 2. 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 3. Negative CT venogram.  No evidence for dural sinus thrombosis. 4. Emphysema (ICD10-J43.9). Superimposed patchy opacities within the posterior left greater than right lungs could reflect atelectasis or infiltrates. Correlation with physical exam and any potential symptomatology recommended. Electronically Signed   By: Jeannine Boga M.D.   On: 05/28/2020 04:07   DG FLUORO GUIDE LUMBAR PUNCTURE  Result Date: 05/29/2020 CLINICAL DATA:  Headache EXAM: DIAGNOSTIC LUMBAR PUNCTURE UNDER FLUOROSCOPIC GUIDANCE COMPARISON:  Head CT 05/27/2020 FLUOROSCOPY TIME:  Fluoroscopy Time:  20 seconds Radiation Exposure Index (if provided by the fluoroscopic device): 1.1 mGy Number of Acquired Spot Images: 0 PROCEDURE: Informed consent was obtained from the patient prior to the procedure, including potential complications of headache, allergy, and pain. With the patient prone, the lower back was prepped with Betadine. 1% Lidocaine was used for local anesthesia. Lumbar puncture was performed at the L2-3 level using a 20 gauge gauge needle with return of clear CSF with an opening pressure of 35 cm water. Twelve ml of CSF were obtained for laboratory studies. The patient tolerated the procedure well and there were no apparent complications. IMPRESSION: Technically successful lumbar puncture under fluoroscopic guidance as above. Electronically Signed   By: Rolm Baptise M.D.   On: 05/29/2020 16:14    Orson Eva, DO  [facility]  If 7PM-7AM, please contact night-coverage [email or web address] Password [email or web address] 06/01/2020, 1:31  PM   LOS: 4 days

## 2020-06-01 NOTE — TOC Progression Note (Signed)
Transition of Care [facility]) - Progression Note    Patient Details  Name: Angela Mullins MRN: [id number] Date of Birth: 06-07-1955  [facility] of [facility]) CM/SW Contact  Boneta Lucks, RN Phone Number: 06/01/2020, 2:09 PM  Clinical Narrative:   Patient admitted with acute headache, diagnosis with meningitis. Patient lives with home with spouse. PT is recommending HHPT and RW, patient is agreeing. Vaughan Basta with [facility] accepted the referral.  [facility] accepted the referral for RW. Orders have been placed.   Expected Discharge Plan: Home/Self Care Barriers to Discharge: Continued Medical Work up  Expected Discharge Plan and Services Expected Discharge Plan: Home/Self Care     Living arrangements for the past 2 months: Single Family Home     DME Agency: [employer] Date DME Agency Contacted: 06/01/20 Time DME Agency Contacted: 73 Representative spoke with at DME Agency: Called in to [facility]    Readmission Risk Interventions Readmission Risk Prevention Plan 06/01/2020  Medication Screening Complete  Transportation Screening Complete

## 2020-06-01 NOTE — Progress Notes (Signed)
Pharmacy Antibiotic Note  Angela Mullins is a 65 y.o. female admitted on 05/27/2020 with sepsis/meningitis.  Pharmacy has been consulted for Ceftriaxone, vancomycin dosing.  Vancomycin trough subtherapeutic at 6 mcg/ml, drawn early so true trough lower. Cr has remained stable.  Plan: Adjust vancomycin to 1200mg  IV q12h Continue Ceftriaxone 2gm IV q12h Repeat vancomycin trough at C[facility] Monitor V/S, labs  Height: 5' 3.75" (161.9 cm) Weight: 58.1 kg (128 lb) IBW/kg (Calculated) : 54.13  Temp (24hrs), Avg:98.2 F (36.8 C), Min:97.9 F (36.6 C), Max:98.4 F (36.9 C)  Recent Labs  Lab 05/27/20 1921 05/27/20 1933 05/27/20 2239 05/28/20 0438 05/30/20 0524 05/31/20 0554 06/01/20 0630 06/01/20 1654  WBC 19.3*  --   --  14.8* 6.3  --  10.4  --   CREATININE 0.88 0.80  --  0.66 0.58 0.53 0.68  --   LATICACIDVEN 1.3  --  0.8  --   --   --   --   --   VANCOTROUGH  --   --   --   --   --   --   --  6*    Estimated Creatinine Clearance: 61.5 mL/min (by C-G formula based on SCr of 0.68 mg/dL).    Allergies  Allergen Reactions  . Demerol [Meperidine Hcl] Other (See Comments)    Does not help  . Quinine Derivatives Nausea And Vomiting    Antimicrobials this admission:  cefepime 5/14 >> 5/16 Ceftriaxone 5/16>> Vancomycin 5/14 >> 5/15 restarted 5/16>> 5/14 metronidazole >>5/16 Doxycycline 5/16>> Acyclovir 5/16>>5/19  Microbiology results: 5/14 BCx: ngtd 5/15 UCx: no growth 5/14 Resp Panel: negative 5/15 MRSA PCR is negative 5/16 CSF c: no growth to date 5/16 Crypto Ag is negative 5/16 HSV negative lyme and RMSF serology  Thank you for allowing pharmacy to be a part of this patient's care.   Arrie Senate, PharmD, BCPS, [facility] Clinical Pharmacist Please check AMION for all [place] numbers 06/01/2020

## 2020-06-01 NOTE — Progress Notes (Addendum)
W[place]ak Hotrontk A. Merlene Laughter, MD     [email or web address]          Angela Mullins is an 65 y.o. female.   ASSESSMENT/PLAN: 1.  Subacute/acute headache syndrome with CSF analysis consistent with meningitis.  The exact etiology is unclear at this time. She reported not doing well overnight with the severe headaches again which she grades 9/10 in the frontal and the occipital areas. She also had photophobia. It appears that only change was that her steroids were discontinued. These will be restarted. We will also start the patient on Fioricet to help with these headaches. Acyclovir will be discontinued because the herpes wanted to PCR is negative. Will continue with the doxycycline for full course of 14 days. The Rocephin will also be continued for at least 7 days. The vancomycin may not be needed and the likely could be discontinued given the negative cultures so far.   It appears that she has done worse overnight with severe headaches again.  She also complains of photophobia. It appears that she got some antibiotics in particular cephalosporins before the spinal tap. The ceftriaxone and vancomycin however were started after the spinal tap.   GENERAL: She appears to be uncomfortable  HEENT: Mild neck stiffness; no trauma noted  ABDOMEN: soft  EXTREMITIES: No edema; no clear Kernig signs  BACK: Normal  SKIN: 2 areas of erythema about 5 cm in the abdominal region below the umbilicus.    MENTAL STATUS: Alert and oriented. Speech, language and cognition are generally intact. Judgment and insight normal.   CRANIAL NERVES: Pupils are equal, round and reactive to light and accomodation; extra ocular movements are full, there is no significant nystagmus; visual fields are full; upper and lower facial muscles are normal in strength and symmetric, there is no flattening of the nasolabial folds; tongue is midline; uvula is midline; shoulder elevation is normal.  MOTOR: Normal tone, bulk  and strength; no pronator drift.  COORDINATION: Left finger to nose is normal, right finger to nose is normal, No rest tremor; no intention tremor; no postural tremor; no bradykinesia.  REFLEXES: Deep tendon reflexes are symmetrical and normal.   SENSATION: Normal to light touch, temperature, and pain.       Blood pressure 128/76, pulse 72, temperature 98.4 F (36.9 C), temperature source Oral, resp. rate 18, height 5' 3.75" (1.619 m), weight 58.1 kg, SpO2 97 %.  Past Medical History:  Diagnosis Date  . Anxiety   . Aortic atherosclerosis ([place]) 05/28/2020  . Emphysema/COPD ([place]) 05/28/2020  . GERD (gastroesophageal reflux disease)   . Hypercholesteremia   . Hypertension   . Vitamin D deficiency     Past Surgical History:  Procedure Laterality Date  . BIOPSY  05/23/2020   Procedure: BIOPSY;  Surgeon: Harvel Quale, MD;  [facility];  Service: Gastroenterology;;  gastric esophageal  . CHOLECYSTECTOMY    . COLONOSCOPY WITH PROPOFOL N/A 05/23/2020   Procedure: COLONOSCOPY WITH PROPOFOL;  Surgeon: Harvel Quale, MD;  [facility];  Service: Gastroenterology;  Laterality: N/A;  10:45 AM  . ESOPHAGEAL DILATION N/A 05/23/2020   Procedure: ESOPHAGEAL DILATION;  Surgeon: Harvel Quale, MD;  [facility];  Service: Gastroenterology;  Laterality: N/A;  . ESOPHAGOGASTRODUODENOSCOPY (EGD) WITH PROPOFOL N/A 05/23/2020   Procedure: ESOPHAGOGASTRODUODENOSCOPY (EGD) WITH PROPOFOL;  Surgeon: Harvel Quale, MD;  [facility];  Service: Gastroenterology;  Laterality: N/A;  . PELVIC FRACTURE SURGERY    . POLYPECTOMY  05/23/2020  Procedure: POLYPECTOMY;  Surgeon: Harvel Quale, MD;  [facility];  Service: Gastroenterology;;  colon  . WISDOM TOOTH EXTRACTION      Family History  Problem Relation Age of Onset  . Alzheimer's disease Mother   . Heart disease Father   . Diabetes Sister    . Heart disease Sister   . Hypertension Brother     Social History:  reports that she has quit smoking. She has never used smokeless tobacco. She reports current alcohol use. She reports that she does not use drugs.  Allergies:  Allergies  Allergen Reactions  . Demerol [Meperidine Hcl] Other (See Comments)    Does not help  . Quinine Derivatives Nausea And Vomiting    Medications: Prior to Admission medications   Medication Sig Start Date End Date Taking? Authorizing Provider  aspirin EC 81 MG tablet Take 81 mg by mouth daily. Swallow whole.   Yes [provider]  atorvastatin (LIPITOR) 80 MG tablet Take 80 mg by mouth daily.   Yes [provider]  cetirizine (ZYRTEC) 10 MG tablet Take 10 mg by mouth daily.   Yes [provider]  clonazePAM (KLONOPIN) 0.5 MG tablet Take 0.5 mg by mouth 2 (two) times daily.   Yes [provider]  diphenhydrAMINE (BENADRYL) 25 mg capsule Take 25 mg by mouth every 8 (eight) hours as needed for itching (Swelling).   Yes [provider]  eletriptan (RELPAX) 40 MG tablet Take 40 mg by mouth every 2 (two) hours as needed for migraine. 05/26/20  Yes [provider]  ergocalciferol (VITAMIN D2) 1.25 MG (50000 UT) capsule Take 50,000 Units by mouth once a week.   Yes [provider]  fenofibrate micronized (LOFIBRA) 200 MG capsule Take 200 mg by mouth daily before breakfast.   Yes [provider]  Halcinonide (HALOG) 0.1 % OINT Apply 1 application topically daily as needed (on bite).   Yes [provider]  ibuprofen (ADVIL) 200 MG tablet Take 400 mg by mouth daily as needed for headache.   Yes [provider]  lisinopril (ZESTRIL) 10 MG tablet Take 10 mg by mouth daily.   Yes [provider]  Multiple Vitamins-Minerals (MULTIVITAMIN WITH MINERALS) tablet Take 1 tablet by mouth daily. 50 +   Yes [provider]  omeprazole (PRILOSEC) 40 MG capsule Take 40 mg  by mouth 2 (two) times daily.   Yes [provider]  Potassium 99 MG TABS Take by mouth daily. Patient not taking: Reported on 05/27/2020    [provider]    Scheduled Meds: . aspirin EC  81 mg Oral Daily  . atorvastatin  80 mg Oral Daily  . doxycycline  100 mg Oral Q12H  . fenofibrate  160 mg Oral Daily  . lisinopril  10 mg Oral Daily  . loratadine  10 mg Oral Daily  . pantoprazole  40 mg Oral Daily   Continuous Infusions: . sodium chloride Stopped (06/01/20 1844)  . 0.9 % NaCl with KCl 20 mEq / L 75 mL/hr at 06/01/20 1729  . cefTRIAXone (ROCEPHIN)  IV 2 g (06/01/20 1844)  . vancomycin     PRN Meds:.acetaminophen **OR** acetaminophen, clonazePAM, diphenhydrAMINE, HYDROcodone-acetaminophen, ondansetron **OR** ondansetron (ZOFRAN) IV, SUMAtriptan     Results for orders placed or performed during the hospital encounter of 05/27/20 (from the past 48 hour(s))  Basic metabolic panel     Status: Abnormal   Collection Time: 05/31/20  5:54 AM  Result Value Ref  Range   Sodium 142 135 - 145 mmol/L   Potassium 3.8 3.5 - 5.1 mmol/L   Chloride 107 98 - 111 mmol/L   CO2 26 22 - 32 mmol/L   Glucose, Bld 147 (H) 70 - 99 mg/dL    Comment: Glucose reference range applies only to samples taken after fasting for at least 8 hours.   BUN 13 8 - 23 mg/dL   Creatinine, Ser 0.53 0.44 - 1.00 mg/dL   Calcium 9.0 8.9 - 10.3 mg/dL   GFR, Estimated >60 >60 mL/min    Comment: (NOTE) Calculated using the CKD-EPI Creatinine Equation (2021)    Anion gap 9 5 - 15    Comment: Performed at [facility], [address] [place], [place] [address]  Hemoglobin A1c     Status: Abnormal   Collection Time: 05/31/20  1:43 PM  Result Value Ref Range   Hgb A1c MFr Bld 5.7 (H) 4.8 - 5.6 %    Comment: (NOTE) Pre diabetes:          5.7%-6.4%  Diabetes:              >6.4%  Glycemic control for   <7.0% adults with diabetes    Mean Plasma Glucose 116.89 mg/dL    Comment: Performed at [facility] [address] [place], [place] [address]  Ehrlichia antibody panel     Status: None   Collection Time: 05/31/20  1:43 PM  Result Value Ref Range   E chaffeensis (HGE) Ab, IgG Negative Neg:<1:64    Comment: (NOTE) HGE IgG levels are detectable 7 to 10 days post infection and persist approximately one year.    E chaffeensis (HGE) Ab, IgM Negative Neg:<1:20    Comment: (NOTE) Due to a reagent backorder, this test was performed using a different assay. The reference interval for this alternate assay is:                                       Negative      <1:64                                       Positive       1:64 or greater IgM levels usually rise 3 to 5 days post infection and fall to normal levels in approximately 30 to 60 days. Performed At: [facility] [place], [place] [id number] Rush Farmer MD [id number]    E.Chaffeensis (HME) IgG Negative Neg:<1:64   E. Chaffeensis (HME) IgM Titer Negative Neg:<1:20    Comment: (NOTE) IgG titers if 1:64 or greater indicate exposure or  acute and convalescent samples showing a four-fold increase, and/or the presence of IgM indicate recent or current infection.   Comprehensive metabolic panel     Status: Abnormal   Collection Time: 06/01/20  6:30 AM  Result Value Ref Range   Sodium 139 135 - 145 mmol/L   Potassium 3.1 (L) 3.5 - 5.1 mmol/L    Comment: DELTA CHECK NOTED   Chloride 104 98 - 111 mmol/L   CO2 26 22 - 32 mmol/L   Glucose, Bld 84 70 - 99 mg/dL    Comment: Glucose reference range applies only to samples taken after fasting for at least 8 hours.   BUN 19 8 - 23  mg/dL   Creatinine, Ser 0.68 0.44 - 1.00 mg/dL   Calcium 8.8 (L) 8.9 - 10.3 mg/dL   Total Protein 5.3 (L) 6.5 - 8.1 g/dL   Albumin 2.5 (L) 3.5 - 5.0 g/dL   AST 35 15 - 41 U/L   ALT 48 (H) 0 - 44 U/L   Alkaline Phosphatase 99 38 - 126 U/L   Total Bilirubin 0.5 0.3 - 1.2 mg/dL   GFR, Estimated >60 >60 mL/min    Comment:  (NOTE) Calculated using the CKD-EPI Creatinine Equation (2021)    Anion gap 9 5 - 15    Comment: Performed at [facility], [address] [place], [place] [address]  CBC with Differential/Platelet     Status: Abnormal   Collection Time: 06/01/20  6:30 AM  Result Value Ref Range   WBC 10.4 4.0 - 10.5 K/uL   RBC 3.98 3.87 - 5.11 MIL/uL   Hemoglobin 11.7 (L) 12.0 - 15.0 g/dL   HCT 35.9 (L) 36.0 - 46.0 %   MCV 90.2 80.0 - 100.0 fL   MCH 29.4 26.0 - 34.0 pg   MCHC 32.6 30.0 - 36.0 g/dL   RDW 12.5 11.5 - 15.5 %   Platelets 339 150 - 400 K/uL   nRBC 0.0 0.0 - 0.2 %   Neutrophils Relative % 68 %   Neutro Abs 7.0 1.7 - 7.7 K/uL   Lymphocytes Relative 26 %   Lymphs Abs 2.7 0.7 - 4.0 K/uL   Monocytes Relative 5 %   Monocytes Absolute 0.5 0.1 - 1.0 K/uL   Eosinophils Relative 0 %   Eosinophils Absolute 0.0 0.0 - 0.5 K/uL   Basophils Relative 0 %   Basophils Absolute 0.0 0.0 - 0.1 K/uL   Immature Granulocytes 1 %   Abs Immature Granulocytes 0.12 (H) 0.00 - 0.07 K/uL    Comment: Performed at [facility], [address], [place], [place] [address]  Vancomycin, trough     Status: Abnormal   Collection Time: 06/01/20  4:54 PM  Result Value Ref Range   Vancomycin Tr 6 (L) 15 - 20 ug/mL    Comment: Performed at [facility], [address] [place], [place] [address]    Studies/Results: HEAD CT  FINDINGS: Brain: Ventricles and sulci are normal in size and configuration. There is no intracranial mass, hemorrhage, extra-axial fluid collection, or midline shift. Brain parenchyma appears unremarkable. No evident acute infarct.  Vascular: No hyperdense vessel.  No evident vascular calcification.  Skull: Bony calvarium appears intact.  Sinuses/Orbits: There are small retention cysts in each inferior maxillary antrum. There is a small retention cyst in a posterior right ethmoid air cell. There is rightward deviation of the nasal septum. Orbits appear symmetric bilaterally.  Other:  Mastoid air cells are clear. There is debris in the right external auditory canal.  IMPRESSION: Normal appearing brain parenchyma. No acute infarct. No mass or hemorrhage.  Foci of mild paranasal sinus disease. Deviated nasal septum. Probable cerumen in the right external auditory canal    HEAD NECK CTA IMPRESSION: 1. Negative CTA for large vessel occlusion. No high-grade or correctable stenosis within the major arterial vasculature of the head and neck. No aneurysm. 2. 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 3. Negative CT venogram.  No evidence for dural sinus thrombosis. 4. Emphysema (ICD10-J43.9). Superimposed patchy opacities within the posterior left greater than right lungs could reflect atelectasis or infiltrates. Correlation with physical exam  and any potential symptomatology recommended    CSF -- WBC 318 RBC 69 segs 45% which is elevated; glucose low at 29 and protein 97    BRAIN MRI W/WO FINDINGS: Brain: No acute infarction, hemorrhage, hydrocephalus, extra-axial collection or mass lesion. Rare foci of T2 hyperintensity are seen within white matter of the cerebral hemispheres, nonspecific. No focus of abnormal contrast enhancement  Vascular: Normal flow voids.  Skull and upper cervical spine: Normal marrow signal.  Sinuses/Orbits: Small mucous retention cyst in the bilateral maxillary sinuses. The orbits maintained.  Other: Mild mucosal thickening of the right mastoid cells.  IMPRESSION: Minimal amount of nonspecific T2 hyperintense lesions the white matter, may represent early microvascular ischemic changes. No focus of abnormal contrast enhancement.       BRAIN MRI SCAN IS REVIEWED IN PERSON. No acute lesions are noted on DWI. AST below eyes also normal. There is no evidence of encephalomalacia or abnormal enhancement.  No significant white matter disease or atrophy is  noted for age.The scan is essentially normal.    Derral Colucci A. Merlene Laughter, M.D.  Diplomate, Tax adviser of Psychiatry and Neurology ( Neurology). 06/01/2020, 6:47 PM

## 2020-06-02 LAB — COMPREHENSIVE METABOLIC PANEL
ALT: 54 U/L — ABNORMAL HIGH (ref 0–44)
AST: 33 U/L (ref 15–41)
Albumin: 2.7 g/dL — ABNORMAL LOW (ref 3.5–5.0)
Alkaline Phosphatase: 104 U/L (ref 38–126)
Anion gap: 8 (ref 5–15)
BUN: 16 mg/dL (ref 8–23)
CO2: 25 mmol/L (ref 22–32)
Calcium: 8.8 mg/dL — ABNORMAL LOW (ref 8.9–10.3)
Chloride: 105 mmol/L (ref 98–111)
Creatinine, Ser: 0.58 mg/dL (ref 0.44–1.00)
GFR, Estimated: >60 mL/min (ref >60)
Glucose, Bld: 110 mg/dL — ABNORMAL HIGH (ref 70–99)
Potassium: 4.4 mmol/L (ref 3.5–5.1)
Sodium: 138 mmol/L (ref 135–145)
Total Bilirubin: 0.4 mg/dL (ref 0.3–1.2)
Total Protein: 5.4 g/dL — ABNORMAL LOW (ref 6.5–8.1)

## 2020-06-02 LAB — CSF CULTURE W GRAM STAIN
Culture: NO GROWTH
Gram Stain: NONE SEEN

## 2020-06-02 LAB — CBC WITH DIFFERENTIAL/PLATELET
Abs Immature Granulocytes: 0.07 10*3/uL (ref 0.00–0.07)
Basophils Absolute: 0 10*3/uL (ref 0.0–0.1)
Basophils Relative: 0 %
Eosinophils Absolute: 0 10*3/uL (ref 0.0–0.5)
Eosinophils Relative: 0 %
HCT: 36.5 % (ref 36.0–46.0)
Hemoglobin: 12.1 g/dL (ref 12.0–15.0)
Immature Granulocytes: 1 %
Lymphocytes Relative: 15 %
Lymphs Abs: 0.9 10*3/uL (ref 0.7–4.0)
MCH: 29.6 pg (ref 26.0–34.0)
MCHC: 33.2 g/dL (ref 30.0–36.0)
MCV: 89.2 fL (ref 80.0–100.0)
Monocytes Absolute: 0.1 10*3/uL (ref 0.1–1.0)
Monocytes Relative: 2 %
Neutro Abs: 4.8 10*3/uL (ref 1.7–7.7)
Neutrophils Relative %: 82 %
Platelets: 335 10*3/uL (ref 150–400)
RBC: 4.09 MIL/uL (ref 3.87–5.11)
RDW: 12.3 % (ref 11.5–15.5)
WBC: 5.9 10*3/uL (ref 4.0–10.5)
nRBC: 0 % (ref 0.0–0.2)

## 2020-06-02 LAB — RMSF, IGG, IFA: RMSF, IGG, IFA: 1:64 {titer} — ABNORMAL HIGH

## 2020-06-02 LAB — ROCKY MTN SPOTTED FVR ABS PNL(IGG+IGM)
RMSF IgG: POSITIVE — AB
RMSF IgM: 0.56 index (ref 0.00–0.89)

## 2020-06-02 LAB — MAGNESIUM: Magnesium: 1.4 mg/dL — ABNORMAL LOW (ref 1.7–2.4)

## 2020-06-02 MED ORDER — MAGNESIUM SULFATE 2 GM/50ML IV SOLN
2.0000 g | Freq: Once | INTRAVENOUS | Status: AC
  Administered 2020-06-02: 2 g via INTRAVENOUS
  Filled 2020-06-02: qty 50

## 2020-06-02 MED ORDER — DEXAMETHASONE 4 MG PO TABS
6.0000 mg | ORAL_TABLET | Freq: Two times a day (BID) | ORAL | Status: DC
  Administered 2020-06-02 – 2020-06-03 (×2): 6 mg via ORAL
  Filled 2020-06-02 (×2): qty 2

## 2020-06-02 NOTE — Progress Notes (Signed)
[place] A. Merlene Laughter, MD     [email or web address]          Angela Mullins is an 65 y.o. female.   ASSESSMENT/PLAN: 1.  Subacute/acute headache syndrome with CSF analysis consistent with meningitis.  Complete course of doxycycline for 14 days. Steroid weaning protocol and the Fioricet as needed for pain.    She appears to have done better today. Headaches are now 5/10. She is eating and resting comfortable.  GENERAL: She appears to be uncomfortable  HEENT: Mild neck stiffness; no trauma noted  ABDOMEN: soft  EXTREMITIES: No edema; no clear Kernig signs  BACK: Normal  SKIN: 2 areas of erythema about 5 cm in the abdominal region below the umbilicus.    MENTAL STATUS: Alert and oriented. Speech, language and cognition are generally intact. Judgment and insight normal.   CRANIAL NERVES: Pupils are equal, round and reactive to light and accomodation; extra ocular movements are full, there is no significant nystagmus; visual fields are full; upper and lower facial muscles are normal in strength and symmetric, there is no flattening of the nasolabial folds; tongue is midline; uvula is midline; shoulder elevation is normal.  MOTOR: Normal tone, bulk and strength; no pronator drift.  COORDINATION: Left finger to nose is normal, right finger to nose is normal, No rest tremor; no intention tremor; no postural tremor; no bradykinesia.  REFLEXES: Deep tendon reflexes are symmetrical and normal.   SENSATION: Normal to light touch, temperature, and pain.       Blood pressure (!) 153/87, pulse 79, temperature 98.1 F (36.7 C), temperature source Oral, resp. rate 20, height 5' 3.75" (1.619 m), weight 58.1 kg, SpO2 96 %.  Past Medical History:  Diagnosis Date  . Anxiety   . Aortic atherosclerosis ([place]) 05/28/2020  . Emphysema/COPD ([place]) 05/28/2020  . GERD (gastroesophageal reflux disease)   . Hypercholesteremia   . Hypertension   . Vitamin D deficiency     Past  Surgical History:  Procedure Laterality Date  . BIOPSY  05/23/2020   Procedure: BIOPSY;  Surgeon: Harvel Quale, MD;  [facility];  Service: Gastroenterology;;  gastric esophageal  . CHOLECYSTECTOMY    . COLONOSCOPY WITH PROPOFOL N/A 05/23/2020   Procedure: COLONOSCOPY WITH PROPOFOL;  Surgeon: Harvel Quale, MD;  [facility]: [facility];  Service: Gastroenterology;  Laterality: N/A;  10:45 AM  . ESOPHAGEAL DILATION N/A 05/23/2020   Procedure: ESOPHAGEAL DILATION;  Surgeon: Harvel Quale, MD;  [facility];  Service: Gastroenterology;  Laterality: N/A;  . ESOPHAGOGASTRODUODENOSCOPY (EGD) WITH PROPOFOL N/A 05/23/2020   Procedure: ESOPHAGOGASTRODUODENOSCOPY (EGD) WITH PROPOFOL;  Surgeon: Harvel Quale, MD;  [facility];  Service: Gastroenterology;  Laterality: N/A;  . PELVIC FRACTURE SURGERY    . POLYPECTOMY  05/23/2020   Procedure: POLYPECTOMY;  Surgeon: Harvel Quale, MD;  [facility]: [facility];  Service: Gastroenterology;;  colon  . WISDOM TOOTH EXTRACTION      Family History  Problem Relation Age of Onset  . Alzheimer's disease Mother   . Heart disease Father   . Diabetes Sister   . Heart disease Sister   . Hypertension Brother     Social History:  reports that she has quit smoking. She has never used smokeless tobacco. She reports current alcohol use. She reports that she does not use drugs.  Allergies:  Allergies  Allergen Reactions  . Demerol [Meperidine Hcl] Other (See Comments)    Does not help  . Quinine Derivatives  Nausea And Vomiting    Medications: Prior to Admission medications   Medication Sig Start Date End Date Taking? Authorizing Provider  aspirin EC 81 MG tablet Take 81 mg by mouth daily. Swallow whole.   Yes [provider]  atorvastatin (LIPITOR) 80 MG tablet Take 80 mg by mouth daily.   Yes [provider]  cetirizine (ZYRTEC) 10 MG tablet Take 10  mg by mouth daily.   Yes [provider]  clonazePAM (KLONOPIN) 0.5 MG tablet Take 0.5 mg by mouth 2 (two) times daily.   Yes [provider]  diphenhydrAMINE (BENADRYL) 25 mg capsule Take 25 mg by mouth every 8 (eight) hours as needed for itching (Swelling).   Yes [provider]  eletriptan (RELPAX) 40 MG tablet Take 40 mg by mouth every 2 (two) hours as needed for migraine. 05/26/20  Yes [provider]  ergocalciferol (VITAMIN D2) 1.25 MG (50000 UT) capsule Take 50,000 Units by mouth once a week.   Yes [provider]  fenofibrate micronized (LOFIBRA) 200 MG capsule Take 200 mg by mouth daily before breakfast.   Yes [provider]  Halcinonide (HALOG) 0.1 % OINT Apply 1 application topically daily as needed (on bite).   Yes [provider]  ibuprofen (ADVIL) 200 MG tablet Take 400 mg by mouth daily as needed for headache.   Yes [provider]  lisinopril (ZESTRIL) 10 MG tablet Take 10 mg by mouth daily.   Yes [provider]  Multiple Vitamins-Minerals (MULTIVITAMIN WITH MINERALS) tablet Take 1 tablet by mouth daily. 50 +   Yes [provider]  omeprazole (PRILOSEC) 40 MG capsule Take 40 mg by mouth 2 (two) times daily.   Yes [provider]  Potassium 99 MG TABS Take by mouth daily. Patient not taking: Reported on 05/27/2020    [provider]    Scheduled Meds: . aspirin EC  81 mg Oral Daily  . atorvastatin  80 mg Oral Daily  . dexamethasone  6 mg Oral Q12H  . doxycycline  100 mg Oral Q12H  . fenofibrate  160 mg Oral Daily  . lisinopril  10 mg Oral Daily  . loratadine  10 mg Oral Daily  . pantoprazole  40 mg Oral Daily   Continuous Infusions: . sodium chloride Stopped (06/01/20 1844)  . 0.9 % NaCl with KCl 20 mEq / L 75 mL/hr at 06/02/20 1557  . cefTRIAXone (ROCEPHIN)  IV 75 mL/hr at 06/02/20 0549  . vancomycin Stopped (06/02/20 1209)   PRN Meds:.acetaminophen **OR**  acetaminophen, butalbital-acetaminophen-caffeine, clonazePAM, diphenhydrAMINE, HYDROcodone-acetaminophen, ondansetron **OR** ondansetron (ZOFRAN) IV, SUMAtriptan     Results for orders placed or performed during the hospital encounter of 05/27/20 (from the past 48 hour(s))  Comprehensive metabolic panel     Status: Abnormal   Collection Time: 06/01/20  6:30 AM  Result Value Ref Range   Sodium 139 135 - 145 mmol/L   Potassium 3.1 (L) 3.5 - 5.1 mmol/L    Comment: DELTA CHECK NOTED   Chloride 104 98 - 111 mmol/L   CO2 26 22 - 32 mmol/L   Glucose, Bld 84 70 - 99 mg/dL    Comment: Glucose reference range applies only to samples taken after fasting for at least 8 hours.   BUN 19 8 - 23 mg/dL   Creatinine, Ser 0.68 0.44 - 1.00 mg/dL   Calcium 8.8 (L) 8.9 - 10.3 mg/dL   Total Protein 5.3 (L) 6.5 - 8.1 g/dL   Albumin 2.5 (  L) 3.5 - 5.0 g/dL   AST 35 15 - 41 U/L   ALT 48 (H) 0 - 44 U/L   Alkaline Phosphatase 99 38 - 126 U/L   Total Bilirubin 0.5 0.3 - 1.2 mg/dL   GFR, Estimated >60 >60 mL/min    Comment: (NOTE) Calculated using the CKD-EPI Creatinine Equation (2021)    Anion gap 9 5 - 15    Comment: Performed at [facility], [address] [place], [place] [address]  CBC with Differential/Platelet     Status: Abnormal   Collection Time: 06/01/20  6:30 AM  Result Value Ref Range   WBC 10.4 4.0 - 10.5 K/uL   RBC 3.98 3.87 - 5.11 MIL/uL   Hemoglobin 11.7 (L) 12.0 - 15.0 g/dL   HCT 35.9 (L) 36.0 - 46.0 %   MCV 90.2 80.0 - 100.0 fL   MCH 29.4 26.0 - 34.0 pg   MCHC 32.6 30.0 - 36.0 g/dL   RDW 12.5 11.5 - 15.5 %   Platelets 339 150 - 400 K/uL   nRBC 0.0 0.0 - 0.2 %   Neutrophils Relative % 68 %   Neutro Abs 7.0 1.7 - 7.7 K/uL   Lymphocytes Relative 26 %   Lymphs Abs 2.7 0.7 - 4.0 K/uL   Monocytes Relative 5 %   Monocytes Absolute 0.5 0.1 - 1.0 K/uL   Eosinophils Relative 0 %   Eosinophils Absolute 0.0 0.0 - 0.5 K/uL   Basophils Relative 0 %   Basophils Absolute 0.0 0.0 - 0.1 K/uL    Immature Granulocytes 1 %   Abs Immature Granulocytes 0.12 (H) 0.00 - 0.07 K/uL    Comment: Performed at [facility], [address], [place], [place] [address]  Vancomycin, trough     Status: Abnormal   Collection Time: 06/01/20  4:54 PM  Result Value Ref Range   Vancomycin Tr 6 (L) 15 - 20 ug/mL    Comment: Performed at [facility], [address] [place], [place] [address]  Comprehensive metabolic panel     Status: Abnormal   Collection Time: 06/02/20  4:18 AM  Result Value Ref Range   Sodium 138 135 - 145 mmol/L   Potassium 4.4 3.5 - 5.1 mmol/L    Comment: DELTA CHECK NOTED   Chloride 105 98 - 111 mmol/L   CO2 25 22 - 32 mmol/L   Glucose, Bld 110 (H) 70 - 99 mg/dL    Comment: Glucose reference range applies only to samples taken after fasting for at least 8 hours.   BUN 16 8 - 23 mg/dL   Creatinine, Ser 0.58 0.44 - 1.00 mg/dL   Calcium 8.8 (L) 8.9 - 10.3 mg/dL   Total Protein 5.4 (L) 6.5 - 8.1 g/dL   Albumin 2.7 (L) 3.5 - 5.0 g/dL   AST 33 15 - 41 U/L   ALT 54 (H) 0 - 44 U/L   Alkaline Phosphatase 104 38 - 126 U/L   Total Bilirubin 0.4 0.3 - 1.2 mg/dL   GFR, Estimated >60 >60 mL/min    Comment: (NOTE) Calculated using the CKD-EPI Creatinine Equation (2021)    Anion gap 8 5 - 15    Comment: Performed at [facility], [address] [place], [place] [address]  Magnesium     Status: Abnormal   Collection Time: 06/02/20  4:18 AM  Result Value Ref Range   Magnesium 1.4 (L) 1.7 - 2.4 mg/dL    Comment: Performed at [facility], [address]  [place], [place] [address]  CBC with Differential/Platelet     Status: None   Collection Time: 06/02/20  4:18 AM  Result Value Ref Range   WBC 5.9 4.0 - 10.5 K/uL   RBC 4.09 3.87 - 5.11 MIL/uL   Hemoglobin 12.1 12.0 - 15.0 g/dL   HCT 36.5 36.0 - 46.0 %   MCV 89.2 80.0 - 100.0 fL   MCH 29.6 26.0 - 34.0 pg   MCHC 33.2 30.0 - 36.0 g/dL   RDW 12.3 11.5 - 15.5 %   Platelets 335 150 - 400 K/uL   nRBC 0.0 0.0 - 0.2 %   Neutrophils  Relative % 82 %   Neutro Abs 4.8 1.7 - 7.7 K/uL   Lymphocytes Relative 15 %   Lymphs Abs 0.9 0.7 - 4.0 K/uL   Monocytes Relative 2 %   Monocytes Absolute 0.1 0.1 - 1.0 K/uL   Eosinophils Relative 0 %   Eosinophils Absolute 0.0 0.0 - 0.5 K/uL   Basophils Relative 0 %   Basophils Absolute 0.0 0.0 - 0.1 K/uL   Immature Granulocytes 1 %   Abs Immature Granulocytes 0.07 0.00 - 0.07 K/uL    Comment: Performed at [facility], [address], [place], [place] [address]    Studies/Results: HEAD CT  FINDINGS: Brain: Ventricles and sulci are normal in size and configuration. There is no intracranial mass, hemorrhage, extra-axial fluid collection, or midline shift. Brain parenchyma appears unremarkable. No evident acute infarct.  Vascular: No hyperdense vessel.  No evident vascular calcification.  Skull: Bony calvarium appears intact.  Sinuses/Orbits: There are small retention cysts in each inferior maxillary antrum. There is a small retention cyst in a posterior right ethmoid air cell. There is rightward deviation of the nasal septum. Orbits appear symmetric bilaterally.  Other: Mastoid air cells are clear. There is debris in the right external auditory canal.  IMPRESSION: Normal appearing brain parenchyma. No acute infarct. No mass or hemorrhage.  Foci of mild paranasal sinus disease. Deviated nasal septum. Probable cerumen in the right external auditory canal    HEAD NECK CTA IMPRESSION: 1. Negative CTA for large vessel occlusion. No high-grade or correctable stenosis within the major arterial vasculature of the head and neck. No aneurysm. 2. 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 3. Negative CT venogram.  No evidence for dural sinus thrombosis. 4. Emphysema (ICD10-J43.9). Superimposed patchy opacities within the posterior left greater than right lungs could reflect atelectasis or infiltrates.  Correlation with physical exam and any potential symptomatology recommended    CSF -- WBC 318 RBC 69 segs 45% which is elevated; glucose low at 29 and protein 97    BRAIN MRI W/WO FINDINGS: Brain: No acute infarction, hemorrhage, hydrocephalus, extra-axial collection or mass lesion. Rare foci of T2 hyperintensity are seen within white matter of the cerebral hemispheres, nonspecific. No focus of abnormal contrast enhancement  Vascular: Normal flow voids.  Skull and upper cervical spine: Normal marrow signal.  Sinuses/Orbits: Small mucous retention cyst in the bilateral maxillary sinuses. The orbits maintained.  Other: Mild mucosal thickening of the right mastoid cells.  IMPRESSION: Minimal amount of nonspecific T2 hyperintense lesions the white matter, may represent early microvascular ischemic changes. No focus of abnormal contrast enhancement.       BRAIN MRI SCAN IS REVIEWED IN PERSON. No acute lesions are noted on DWI. AST below eyes also normal. There is no evidence of encephalomalacia or abnormal enhancement.  No  significant white matter disease or atrophy is noted for age.The scan is essentially normal.    Quinnlan Abruzzo A. Merlene Laughter, M.D.  Diplomate, Tax adviser of Psychiatry and Neurology ( Neurology). 06/02/2020, 4:22 PM

## 2020-06-02 NOTE — Progress Notes (Signed)
PROGRESS NOTE  Arnetra Huxford [id number] DOB: 08-28-1955 DOA: 05/27/2020 PCP: Coolidge Breeze, FNP  Brief History: 65 y.o.femalewith medical history significant ofanxiety, GERD, hyperlipidemia, hypertension, vitamin D deficiency who is coming to the emergency department due to headache, similar to her migraines, but more intense than usual that has not responded to Relpax for the past 48 hours associated with nausea, vomiting and generalized weakness. Her husband also stated in the ED that her mentation has been decreased. She had a colonoscopy on Tuesday with some polypectomies, but denies abdominal pain, diarrhea, constipation, melena hematochezia.. Wednesday was uneventful, but then developed symptoms of Thursday. EMS was called, but subsequently they decided not to come to the ED. No history of fever at home, but was febrile at 101 F in the emergency department. No sore throat, cough, dyspnea, wheezing or hemoptysis. No chest pain, palpitations, lower extremity edema. No dysuria, frequency or hematuria.  ED Course:Initial vital signs were temperature98.5 F, pulse 99, respirations 17, BP 170/93 mmHg O2 sat 98% on room air. The patient received acetaminophen 650 mg p.o., 2000 mL of LR bolus, Zofran 4 mg IVP, hydromorphone 1 mg IVP x2, cefepime, metronidazole and vancomycin  Assessment/Plan: Aseptic Meningitis -cannot completely r/o septic meningitis as patient had about 2 days abx prior to LP -CSF cultures remain neg -cryptococcus antigen neg -HSV PCR--neg -continue empiric vanco and ceftriaxone--finish 7 day course -Discontinue acyclovir -d/c dexamethasone -05/29/20 LP showed WBC 318, 69 RBC, gluc 29, pro 97 -await lyme and RMSF serology-->NEG -Ehrlichia serology--NEG -06/01/20--frontal headache slight worse-->toradol, bolus IVF, norco, steroids restarted per neurology -06/02/20--headache improving -CTA head and neck--Negative CTA for large vessel occlusion. No  high-grade or correctable stenosis within the major arterial vasculature of the head and neck. No aneurysm -MRI brain--no abnormal areas on contrast enhancement  Acute respiratory failure with hypoxia -initially placed on 4L>>2L>>RA -due to COPD exacerbation -started duoneb -started pulmicort  Hyperlipidemia -continue statin and fenofibrate  HTN -continue lisinopril  GERD -continue PPI  Hyperglycemia -check A1C--5.7  Insect bite -pt endorses insect bite about 2 weeks prior to admit -await serologies--Lyme and RMSF--neg -Ehrlichia--pending  Hypokalemia/Hypomagnesemia -repleted      Status is: Inpatient  Remains inpatient appropriate because:IV treatments appropriate due to intensity of illness or inability to take PO   Dispo: The patient is from:Home Anticipated d/c is [id number] Patient currently is not medically stable to d/c. Difficult to place patient No    Total time spent 35 minutes.  Greater than 50% spent face to face counseling and coordinating care.     Family Communication:son and niece updated 5/20  Consultants:neurology  Code Status: FULL   DVT Prophylaxis: SCDs   Procedures: As Listed in Progress Note Above  Antibiotics: Acyclovir 5/16>>5/19 Ceftriaxone 5/16>> Cefepime 5/14>>5/16 vanc5/14>>  Total time spent 35 minutes.  Greater than 50% spent face to face counseling and coordinating care.     Subjective: Patient states headache is much better today with restarting steroids.  Denies f/c, cp, sob, visual disturbance, focal extremity weakness, n/v/d.  Objective: Vitals:   06/01/20 0343 06/01/20 1245 06/01/20 2058 06/02/20 0503  BP: (!) 147/83 128/76 (!) 160/83 (!) 154/99  Pulse: 65 72 68 63  Resp: 16 18 18 18   Temp: 97.9 F (36.6 C) 98.4 F (36.9 C) 98.6 F (37 C) 98.1 F (36.7 C)  TempSrc: Oral Oral Oral Oral  SpO2: 96% 97% 97% 93%  Weight:       Height:  Intake/Output Summary (Last 24 hours) at 06/02/2020 1150 Last data filed at 06/02/2020 1020 Gross per 24 hour  Intake 2343.15 ml  Output 600 ml  Net 1743.15 ml   Weight change:  Exam:   General:  Pt is alert, follows commands appropriately, not in acute distress  HEENT: No icterus, No thrush, No neck mass, [place]/AT  Cardiovascular: RRR, S1/S2, no rubs, no gallops  Respiratory: diminished BS.  No wheeze  Abdomen: Soft/+BS, non tender, non distended, no guarding  Extremities: No edema, No lymphangitis, No petechiae, No rashes, no synovitis  Neuro:  CN II-XII intact, strength 4/5 in RUE, RLE, strength 4/5 LUE, LLE; sensation intact bilateral; no dysmetria; babinski equivocal     Data Reviewed: I have personally reviewed following labs and imaging studies Basic Metabolic Panel: Recent Labs  Lab 05/28/20 0438 05/30/20 0524 05/31/20 0554 06/01/20 0630 06/02/20 0418  NA 139 141 142 139 138  K 3.8 3.3* 3.8 3.1* 4.4  CL 105 107 107 104 105  CO2 26 26 26 26 25   GLUCOSE 127* 94 147* 84 110*  BUN 12 19 13 19 16   CREATININE 0.66 0.58 0.53 0.68 0.58  CALCIUM 8.8* 8.6* 9.0 8.8* 8.8*  MG  --   --   --   --  1.4*   Liver Function Tests: Recent Labs  Lab 05/27/20 1921 05/28/20 0438 05/30/20 0524 06/01/20 0630 06/02/20 0418  AST 28 45* 26 35 33  ALT 49* 66* 49* 48* 54*  ALKPHOS 112 151* 121 99 104  BILITOT 0.9 0.7 0.5 0.5 0.4  PROT 8.3* 5.8* 5.4* 5.3* 5.4*  ALBUMIN 4.2 2.8* 2.4* 2.5* 2.7*   Recent Labs  Lab 05/27/20 1921  LIPASE 19   No results for input(s): AMMONIA in the last 168 hours. Coagulation Profile: Recent Labs  Lab 05/27/20 1921  INR 1.1   CBC: Recent Labs  Lab 05/27/20 1921 05/27/20 1933 05/28/20 0438 05/30/20 0524 06/01/20 0630 06/02/20 0418  WBC 19.3*  --  14.8* 6.3 10.4 5.9  NEUTROABS 17.4*  --  12.6*  --  7.0 4.8  HGB 15.1* 15.6* 11.9* 12.0 11.7* 12.1  HCT 45.7 46.0 37.2 35.8* 35.9* 36.5  MCV 89.6  --  92.1 88.8 90.2  89.2  PLT 273  --  228 224 339 335   Cardiac Enzymes: No results for input(s): CKTOTAL, CKMB, CKMBINDEX, TROPONINI in the last 168 hours. BNP: Invalid input(s): POCBNP CBG: Recent Labs  Lab 05/27/20 1934  GLUCAP 136*   HbA1C: Recent Labs    05/31/20 1343  HGBA1C 5.7*   Urine analysis:    Component Value Date/Time   COLORURINE YELLOW 05/28/2020 1209   APPEARANCEUR CLEAR 05/28/2020 1209   LABSPEC 1.025 05/28/2020 1209   PHURINE 5.0 05/28/2020 1209   GLUCOSEU 50 (A) 05/28/2020 1209   HGBUR NEGATIVE 05/28/2020 1209   [place] 05/28/2020 1209   KETONESUR 5 (A) 05/28/2020 1209   PROTEINUR NEGATIVE 05/28/2020 1209   NITRITE NEGATIVE 05/28/2020 1209   LEUKOCYTESUR NEGATIVE 05/28/2020 1209   Sepsis Labs: @LABRCNTIP (procalcitonin:4,lacticidven:4) ) Recent Results (from the past 240 hour(s))  Blood Culture (routine x 2)     Status: None   Collection Time: 05/27/20  7:55 PM   Specimen: BLOOD  Result Value Ref Range Status   Specimen Description BLOOD BLOOD RIGHT FOREARM  Final   Special Requests   Final    Blood Culture adequate volume BOTTLES DRAWN AEROBIC AND ANAEROBIC   Culture   Final    NO GROWTH 5  DAYS Performed at [facility], [address] [place], [place] [address]    Report Status 06/01/2020 FINAL  Final  Blood Culture (routine x 2)     Status: None   Collection Time: 05/27/20  8:00 PM   Specimen: BLOOD  Result Value Ref Range Status   Specimen Description BLOOD BLOOD LEFT ARM  Final   Special Requests   Final    Blood Culture adequate volume BOTTLES DRAWN AEROBIC AND ANAEROBIC   Culture   Final    NO GROWTH 5 DAYS Performed at [facility], [address], [place], [place] [address]    Report Status 06/01/2020 FINAL  Final  Resp Panel by RT-PCR (Flu A&B, Covid) Nasopharyngeal Swab     Status: None   Collection Time: 05/27/20  8:26 PM   Specimen: Nasopharyngeal Swab; Nasopharyngeal(NP) swabs in vial transport medium  Result Value Ref Range  Status   SARS Coronavirus 2 by RT PCR NEGATIVE NEGATIVE Final    Comment: (NOTE) SARS-CoV-2 target nucleic acids are NOT DETECTED.  The SARS-CoV-2 RNA is generally detectable in upper respiratory specimens during the acute phase of infection. The lowest concentration of SARS-CoV-2 viral copies this assay can detect is 138 copies/mL. A negative result does not preclude SARS-Cov-2 infection and should not be used as the sole basis for treatment or other patient management decisions. A negative result may occur with  improper specimen collection/handling, submission of specimen other than nasopharyngeal swab, presence of viral mutation(s) within the areas targeted by this assay, and inadequate number of viral copies(<138 copies/mL). A negative result must be combined with clinical observations, patient history, and epidemiological information. The expected result is Negative.  Fact Sheet for Patients:  BloggerCourse.com  Fact Sheet for Healthcare Providers:  SeriousBroker.it  This test is no t yet approved or cleared by the [place] FDA and  has been authorized for detection and/or diagnosis of SARS-CoV-2 by FDA under an Emergency Use Authorization (EUA). This EUA will remain  in effect (meaning this test can be used) for the duration of the COVID-19 declaration under Section 564(b)(1) of the Act, 21 U.S.C.section 360bbb-3(b)(1), unless the authorization is terminated  or revoked sooner.       Influenza A by PCR NEGATIVE NEGATIVE Final   Influenza B by PCR NEGATIVE NEGATIVE Final    Comment: (NOTE) The Xpert Xpress SARS-CoV-2/FLU/RSV plus assay is intended as an aid in the diagnosis of influenza from Nasopharyngeal swab specimens and should not be used as a sole basis for treatment. Nasal washings and aspirates are unacceptable for Xpert Xpress SARS-CoV-2/FLU/RSV testing.  Fact Sheet for  Patients: BloggerCourse.com  Fact Sheet for Healthcare Providers: SeriousBroker.it  This test is not yet approved or cleared by the [place] FDA and has been authorized for detection and/or diagnosis of SARS-CoV-2 by FDA under an Emergency Use Authorization (EUA). This EUA will remain in effect (meaning this test can be used) for the duration of the COVID-19 declaration under Section 564(b)(1) of the Act, 21 U.S.C. section 360bbb-3(b)(1), unless the authorization is terminated or revoked.  Performed at [facility], [address] [place], [place] [address]   MRSA PCR Screening     Status: None   Collection Time: 05/28/20  9:01 AM   Specimen: Nasopharyngeal  Result Value Ref Range Status   MRSA by PCR NEGATIVE NEGATIVE Final    Comment:        The GeneXpert MRSA Assay (FDA approved for NASAL specimens only), is one component of a  comprehensive MRSA colonization surveillance program. It is not intended to diagnose MRSA infection nor to guide or monitor treatment for MRSA infections. Performed at [facility], [address] [place], [place] [address]   Urine culture     Status: None   Collection Time: 05/28/20 12:09 PM   Specimen: Urine, Catheterized  Result Value Ref Range Status   Specimen Description   Final    URINE, CATHETERIZED Performed at [facility], [address] [place], [place] [address]    Special Requests   Final    NONE Performed at [facility], [address] [place], [place] [address]    Culture   Final    NO GROWTH Performed at [facility], [place] [address], [place], [place] [address]    Report Status 05/30/2020 FINAL  Final  CSF culture w Stat Gram Stain     Status: None   Collection Time: 05/29/20  3:30 PM   Specimen: CSF; Cerebrospinal Fluid  Result Value Ref Range Status   Specimen Description   Final    CSF Performed at [facility], [address] [place], [place] [address]     Special Requests   Final    NONE Performed at [facility], [address] [place], [place] [address]    Gram Stain   Final    NO ORGANISMS SEEN WBC PRESENT,BOTH PMN AND MONONUCLEAR Gram Stain Report Called to,Read Back By and Verified With: COLEMAN L. AT [id number] ON [id number] BY THOMPSON S. Performed at [facility], [address] [place], [place] [address]    Culture   Final    NO GROWTH 3 DAYS Performed at [facility] [address] [place], [place] [address]    Report Status 06/02/2020 FINAL  Final     Scheduled Meds: . aspirin EC  81 mg Oral Daily  . atorvastatin  80 mg Oral Daily  . dexamethasone  6 mg Oral Q12H  . doxycycline  100 mg Oral Q12H  . fenofibrate  160 mg Oral Daily  . lisinopril  10 mg Oral Daily  . loratadine  10 mg Oral Daily  . pantoprazole  40 mg Oral Daily   Continuous Infusions: . sodium chloride Stopped (06/01/20 1844)  . 0.9 % NaCl with KCl 20 mEq / L 75 mL/hr at 06/02/20 0954  . cefTRIAXone (ROCEPHIN)  IV 2 g (06/02/20 0517)  . vancomycin 1,000 mg (06/02/20 1108)    Procedures/Studies: CT Abdomen Pelvis Wo Contrast  Result Date: 05/27/2020 CLINICAL DATA:  Abdominal pain and fever EXAM: CT ABDOMEN AND PELVIS WITHOUT CONTRAST TECHNIQUE: Multidetector CT imaging of the abdomen and pelvis was performed following the standard protocol without IV contrast. COMPARISON:  CT February 17, 2008 FINDINGS: Lower chest: Bibasilar atelectasis/scarring. Normal size heart. No significant pericardial effusion/thickening. Small hiatal hernia. Hepatobiliary: Unremarkable noncontrast appearance of the hepatic parenchyma. Gallbladder is surgically absent. No biliary ductal dilation. Pancreas: Mild retroperitoneal/peripancreatic fat stranding. No pancreatic ductal dilation. Spleen: Within normal limits Adrenals/Urinary Tract: Mild apparent thickening of the the bilateral adrenal glands with adjacent inflammatory stranding. No hydronephrosis. No nephrolithiasis. No  contour deforming renal masses. The urinary bladder is grossly unremarkable for degree of distension. Stomach/Bowel: Small hiatal hernia otherwise the stomach is grossly unremarkable for degree of distension. No pathologic dilation of small bowel. Tiny appendicoliths in a non appendix. No suspicious colonic wall thickening or mass like lesions. Scattered colonic diverticulosis without findings of acute diverticulitis. Vascular/Lymphatic: Aortic atherosclerosis. No enlarged abdominal or pelvic lymph  nodes. Reproductive: Uterus and bilateral adnexa are unremarkable. Other: No pneumoperitoneum. Musculoskeletal: Multilevel degenerative changes spine. No acute osseous abnormality. IMPRESSION: 1. Mild retroperitoneal/peripancreatic fat stranding nonspecific but possibly representing acute pancreatitis. Correlation with serum lipase is recommended. 2. Mild apparent thickening of the bilateral adrenal glands with adjacent inflammatory stranding, which is nonspecific but may represent adrenal congestion. 3. Colonic diverticulosis without findings of acute diverticulitis. 4. Small hiatal hernia. 5. Aortic atherosclerosis. Aortic Atherosclerosis (ICD10-I70.0). Electronically Signed   By: Dahlia Bailiff MD   On: 05/27/2020 22:01   CT Angio Head W or Wo Contrast  Result Date: 05/28/2020 CLINICAL DATA:  Initial evaluation for acute headache. EXAM: CT ANGIOGRAPHY HEAD AND NECK CT VENOGRAM OF THE HEAD WITH CONTRAST. TECHNIQUE: Multidetector CT imaging of the head and neck was performed using the standard protocol during bolus administration of intravenous contrast. Multiplanar CT image reconstructions and MIPs were obtained to evaluate the vascular anatomy. Carotid stenosis measurements (when applicable) are obtained utilizing NASCET criteria, using the distal internal carotid diameter as the denominator. CONTRAST:  5mL OMNIPAQUE IOHEXOL 350 MG/ML SOLN COMPARISON:  Prior noncontrast CT from 05/27/2020. FINDINGS: CTA NECK  FINDINGS Aortic arch: Visualized aortic arch normal caliber with normal branch pattern. Mild atheromatous change about the arch and origin of the great vessels without hemodynamically significant stenosis. Right carotid system: Right common and internal carotid arteries widely patent without stenosis, dissection or occlusion. Minimal atheromatous plaque about the right bifurcation without stenosis. Left carotid system: Left CCA patent from its origin to the bifurcation without stenosis. 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 Left ICA patent distally without stenosis, dissection or occlusion. Vertebral arteries: Both vertebral arteries arise from the subclavian arteries. No proximal subclavian artery stenosis. Vertebral arteries patent within the neck without stenosis, dissection or occlusion. Skeleton: No visible acute osseous finding. No discrete or worrisome osseous lesions. Other neck: No other acute soft tissue abnormality within the neck. No mass or adenopathy. Upper chest: Advanced emphysema. Superimposed scattered patchy opacities within the posterior left greater than right lungs, could reflect atelectasis or infiltrates. Review of the MIP images confirms the above findings CTA HEAD FINDINGS Anterior circulation: Petrous segments widely patent bilaterally. Minimal atheromatous change within the carotid siphons without significant stenosis or other abnormality. A1 segments widely patent. Normal anterior communicating artery complex. Anterior cerebral arteries patent to their distal aspects without stenosis. No M1 stenosis or occlusion. Right M1 bifurcates early. Bifurcations M cells within normal limits. Distal MCA branches well perfused and symmetric. Posterior circulation: Both V4 segments patent to the vertebrobasilar junction without stenosis. Both PICA origins patent and normal. Basilar patent to its distal aspect without stenosis.  Superior cerebellar arteries patent bilaterally. Both PCAs primarily supplied via the basilar well perfused or distal aspects. Small right posterior communicating artery noted. Venous sinuses: Normal enhancement seen throughout the superior sagittal sinus to the torcula. Torcula itself appears patent. Transverse and sigmoid sinuses are patent as are the visualized proximal internal jugular veins. Straight sinus, vein of Galen, internal cerebral veins, and basal veins of Rosenthal appear patent. No evidence for dural sinus thrombosis. Cavernous sinus appears patent. Superior orbital veins symmetric and within normal limits. Anatomic variants: None significant. No aneurysm or other vascular abnormality. Review of the MIP images confirms the above findings IMPRESSION: 1. Negative CTA for large vessel occlusion. No high-grade or correctable stenosis within the major arterial vasculature of the head and neck. No aneurysm. 2. Bulky calcified plaque  about the left carotid bulb with associated stenosis of up to 40% by NASCET criteria. 3. Negative CT venogram.  No evidence for dural sinus thrombosis. 4. Emphysema (ICD10-J43.9). Superimposed patchy opacities within the posterior left greater than right lungs could reflect atelectasis or infiltrates. Correlation with physical exam and any potential symptomatology recommended. Electronically Signed   By: Jeannine Boga M.D.   On: 05/28/2020 04:07   CT HEAD WO CONTRAST  Result Date: 05/27/2020 CLINICAL DATA:  Headache.  Dysarthria EXAM: CT HEAD WITHOUT CONTRAST TECHNIQUE: Contiguous axial images were obtained from the base of the skull through the vertex without intravenous contrast. COMPARISON:  None. FINDINGS: Brain: Ventricles and sulci are normal in size and configuration. There is no intracranial mass, hemorrhage, extra-axial fluid collection, or midline shift. Brain parenchyma appears unremarkable. No evident acute infarct. Vascular: No hyperdense vessel.  No  evident vascular calcification. Skull: Bony calvarium appears intact. Sinuses/Orbits: There are small retention cysts in each inferior maxillary antrum. There is a small retention cyst in a posterior right ethmoid air cell. There is rightward deviation of the nasal septum. Orbits appear symmetric bilaterally. Other: Mastoid air cells are clear. There is debris in the right external auditory canal. IMPRESSION: Normal appearing brain parenchyma. No acute infarct. No mass or hemorrhage. Foci of mild paranasal sinus disease. Deviated nasal septum. Probable cerumen in the right external auditory canal. Electronically Signed   By: Lowella Grip III M.D.   On: 05/27/2020 20:00   CT Angio Neck W and/or Wo Contrast  Result Date: 05/28/2020 CLINICAL DATA:  Initial evaluation for acute headache. EXAM: CT ANGIOGRAPHY HEAD AND NECK CT VENOGRAM OF THE HEAD WITH CONTRAST. TECHNIQUE: Multidetector CT imaging of the head and neck was performed using the standard protocol during bolus administration of intravenous contrast. Multiplanar CT image reconstructions and MIPs were obtained to evaluate the vascular anatomy. Carotid stenosis measurements (when applicable) are obtained utilizing NASCET criteria, using the distal internal carotid diameter as the denominator. CONTRAST:  92mL OMNIPAQUE IOHEXOL 350 MG/ML SOLN COMPARISON:  Prior noncontrast CT from 05/27/2020. FINDINGS: CTA NECK FINDINGS Aortic arch: Visualized aortic arch normal caliber with normal branch pattern. Mild atheromatous change about the arch and origin of the great vessels without hemodynamically significant stenosis. Right carotid system: Right common and internal carotid arteries widely patent without stenosis, dissection or occlusion. Minimal atheromatous plaque about the right bifurcation without stenosis. Left carotid system: Left CCA patent from its origin to the bifurcation without stenosis. Bulky calcified plaque about the left carotid bulb with  associated stenosis of up to 40% by NASCET criteria. Left ICA patent distally without stenosis, dissection or occlusion. Vertebral arteries: Both vertebral arteries arise from the subclavian arteries. No proximal subclavian artery stenosis. Vertebral arteries patent within the neck without stenosis, dissection or occlusion. Skeleton: No visible acute osseous finding. No discrete or worrisome osseous lesions. Other neck: No other acute soft tissue abnormality within the neck. No mass or adenopathy. Upper chest: Advanced emphysema. Superimposed scattered patchy opacities within the posterior left greater than right lungs, could reflect atelectasis or infiltrates. Review of the MIP images confirms the above findings CTA HEAD FINDINGS Anterior circulation: Petrous segments widely patent bilaterally. Minimal atheromatous change within the carotid siphons without significant stenosis or other abnormality. A1 segments widely patent. Normal anterior communicating artery complex. Anterior cerebral arteries patent to their distal aspects without stenosis. No M1 stenosis or occlusion. Right M1 bifurcates early. Bifurcations M cells within normal limits. Distal MCA branches well perfused and symmetric. Posterior circulation:  Both V4 segments patent to the vertebrobasilar junction without stenosis. Both PICA origins patent and normal. Basilar patent to its distal aspect without stenosis. Superior cerebellar arteries patent bilaterally. Both PCAs primarily supplied via the basilar well perfused or distal aspects. Small right posterior communicating artery noted. Venous sinuses: Normal enhancement seen throughout the superior sagittal sinus to the torcula. Torcula itself appears patent. Transverse and sigmoid sinuses are patent as are the visualized proximal internal jugular veins. Straight sinus, vein of Galen, internal cerebral veins, and basal veins of Rosenthal appear patent. No evidence for dural sinus thrombosis. Cavernous  sinus appears patent. Superior orbital veins symmetric and within normal limits. Anatomic variants: None significant. No aneurysm or other vascular abnormality. Review of the MIP images confirms the above findings IMPRESSION: 1. Negative CTA for large vessel occlusion. No high-grade or correctable stenosis within the major arterial vasculature of the head and neck. No aneurysm. 2. 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 3. Negative CT venogram.  No evidence for dural sinus thrombosis. 4. Emphysema (ICD10-J43.9). Superimposed patchy opacities within the posterior left greater than right lungs could reflect atelectasis or infiltrates. Correlation with physical exam and any potential symptomatology recommended. Electronically Signed   By: Jeannine Boga M.D.   On: 05/28/2020 04:07   MR BRAIN W WO CONTRAST  Result Date: 05/31/2020 CLINICAL DATA:  Sarcoidosis; encephalitis. EXAM: MRI HEAD WITHOUT AND WITH CONTRAST TECHNIQUE: Multiplanar, multiecho pulse sequences of the brain and surrounding structures were obtained without and with intravenous contrast. CONTRAST:  84mL GADAVIST GADOBUTROL 1 MMOL/ML IV SOLN COMPARISON:  Head CT May 27, 2020 FINDINGS: Brain: No acute infarction, hemorrhage, hydrocephalus, extra-axial collection or mass lesion. Rare foci of T2 hyperintensity are seen within white matter of the cerebral hemispheres, nonspecific. No focus of abnormal contrast enhancement Vascular: Normal flow voids. Skull and upper cervical spine: Normal marrow signal. Sinuses/Orbits: Small mucous retention cyst in the bilateral maxillary sinuses. The orbits maintained. Other: Mild mucosal thickening of the right mastoid cells. IMPRESSION: Minimal amount of nonspecific T2 hyperintense lesions the white matter, may represent early microvascular ischemic changes. No focus of abnormal contrast enhancement. Electronically Signed   By: Pedro Earls M.D.   On: 05/31/2020 10:39   DG Chest Port 1 View  Result Date: 05/27/2020 CLINICAL DATA:  Questionable sepsis - evaluate for abnormality EXAM: PORTABLE CHEST 1 VIEW COMPARISON:  Radiograph 01/28/2006 FINDINGS: The cardiomediastinal contours are normal. Chronic blunting of left costophrenic angle. Mild interstitial coarsening. Pulmonary vasculature is normal. No consolidation, pleural effusion, or pneumothorax. No acute osseous abnormalities are seen. IMPRESSION: 1. Mild interstitial coarsening of unknown acuity, likely chronic. 2. Chronic blunting of the left costophrenic angle. Electronically Signed   By: Keith Rake M.D.   On: 05/27/2020 20:41   DG Carlena Hurl SPEECH PATH  Result Date: 05/18/2020 [facility] [address] [place], [place], [address] Phone: [phone]   Fax:  [phone] Modified Barium Swallow Patient Details Name: Minta Fair MRN: [id number] Date of Birth: 01/16/1955 No data recorded Encounter Date: 05/18/2020  End of Session - 05/18/20 1254   Visit Number 1   Number of Visits 1   Authorization Type [employer]   SLP Start Time 2778   SLP Stop Time  1215   SLP Time Calculation (min) 30 min   Activity Tolerance Patient tolerated treatment well     Past Medical History: Diagnosis Date . Anxiety  .  GERD (gastroesophageal reflux disease)  . Hypercholesteremia  . Hypertension  . Vitamin D deficiency  Past Surgical History: Procedure Laterality Date . CHOLECYSTECTOMY   . PELVIC FRACTURE SURGERY   . WISDOM TOOTH EXTRACTION   There were no vitals filed for this visit.  Subjective Assessment - 05/18/20 1247   Subjective "I have to throw my head back to swallow pills."   Special Tests MBSS   Currently in Pain? No/denies      General - 05/18/20 1248    General Information  Date of Onset 05/10/20   HPI Zaiyah Tank is a 65 yo female who was referred by Dr. Maylon Peppers for MBSS due to Pt with reports of dysphagia. Pt with PMH significant  for anxiety, HTN, HLD, and GERD. Patient reports that since she was a teenager she has felt recurrent episodes of dysphagia when swallowing pills, very occasionally when swallowing solid food.  She reported that she tries to chew very well reported to avoid having any choking episodes.  However, she reported that she feels "strangled" when food goes occasionally as if there was some sort of pressure in her throat.  Does not have any symptoms when swallowing liquids.  More recently, for the last year she has presented episodes of feeling a foreign body in her throat as "if something was lodged in her throat".  She constantly clears her throat but reports no phlegm comes out. She quit smoking last year. Her Prilosec was increased to 40 mg twice per day last week. She does not sleep with the HOB elevated, but uses a wedge. No recent reports of PNA.   Type of Study MBS-Modified Barium Swallow Study   Previous Swallow Assessment None on record   Diet Prior to this Study Regular;Thin liquids   Temperature Spikes Noted No   Respiratory Status Room air   History of Recent Intubation No   Behavior/Cognition Alert;Cooperative;Pleasant mood   Oral Cavity Assessment Within Functional Limits   Oral Care Completed by SLP No   Oral Cavity - Dentition Adequate natural dentition   Vision Functional for self feeding   Self-Feeding Abilities Able to feed self   Patient Positioning Upright in chair   Baseline Vocal Quality Normal   Volitional Cough Strong   Volitional Swallow Able to elicit   Anatomy Within functional limits   Pharyngeal Secretions Not observed secondary MBS      Oral Preparation/Oral Phase - 05/18/20 1248    Oral Preparation/Oral Phase  Oral Phase Impaired    Oral - Thin  Oral - Thin Teaspoon Within functional limits   Oral - Thin Cup Within functional limits    Oral - Solids  Oral - Puree Within functional limits   Oral - Regular Within functional limits   Oral - Pill Within functional limits    Electrical  stimulation - Oral Phase  Was Electrical Stimulation Used No      Pharyngeal Phase - 05/18/20 1250    Pharyngeal Phase  Pharyngeal Phase Within functional limits      Cricopharyngeal Phase - 05/18/20 1250    Cervical Esophageal Phase  Cervical Esophageal Phase Impaired    Cervical Esophageal Phase - Comment  Cervical Esophageal Comment Pt points to ~C7 as source of globus with solids and pill   Other Esophageal Phase Observations Pt with delayed esophageal transit, incomplete clearance with primary wave, stasis of pill near LES which cleared with puree wash (did not clear with thin wash)      Plan -  05/18/20 1254   Clinical Impression Statement MBSS completed. Pt assessed with barium tinged thin, puree, regular textures, and barium tablet. Oropharyngeal swallow is WNL with the exception of Pt producing effortful swallows with solids, however suspect this is due to esophageal component. Swallow initiation was timely and hyolaryngeal excursion WNL, no penetration/aspiration or significant pharyngeal residuals post swallow. Esophageal sweep completed before and after presentation of barium table revealed incomplete clearance of barium via primary wave. The barium tablet became transiently delayed near the LES and was not cleared by liquid wash, but was cleared with a bite of puree. Pt reports that she typically takes larger pills balled up into small pieces of bread to facilitate swallow. Pt's reports of globus, effortful swallows, non productive coughing, and regurgitation appear more consistent with esophageal dysphagia. She was recently increased to prilosec 2x per day. Pt was encouraged to alternate solids and liquids, adhere to reflux precautions, and elevate the head of her bed. No further SLP services indicated at this time. Pt has EGD scheduled for Tuesday with Dr. Jenetta Downer.   Consulted and Agree with Plan of Care Patient     Patient will benefit from skilled therapeutic intervention in order to improve the  following deficits and impairments:  Dysphagia, oropharyngeal phase  Recommendations/Treatment - 05/18/20 1252    Swallow Evaluation Recommendations  Recommended Consults Consider esophageal assessment   SLP Diet Recommendations Age appropriate regular;Thin   Liquid Administration via Cup   Medication Administration Whole meds with liquid   Supervision Patient able to self feed   Compensations Follow solids with liquid   Postural Changes Seated upright at 90 degrees;Remain upright for at least 30 minutes after feeds/meals      Prognosis - 05/18/20 1253    Prognosis  Prognosis for Safe Diet Advancement Good   Barriers/Prognosis Comment suspected primary  esophageal dysphagia    Individuals Consulted  Consulted and Agree with Results and Recommendations Patient   Report Sent to  Referring physician     Problem List Patient Active Problem List  Diagnosis Date Noted . Dysphagia 05/10/2020 . Throat discomfort 05/10/2020 . GERD (gastroesophageal reflux disease) 05/10/2020 Thank you, Genene Churn, [facility] 05/18/2020, 1:01 PM [facility] [address] [place], [place], [address] Phone: [phone]   Fax:  [phone] Name: Tiena Hires MRN: [id number] Date of Birth: 08/19/1955  CT VENOGRAM HEAD  Result Date: 05/28/2020 CLINICAL DATA:  Initial evaluation for acute headache. EXAM: CT ANGIOGRAPHY HEAD AND NECK CT VENOGRAM OF THE HEAD WITH CONTRAST. TECHNIQUE: Multidetector CT imaging of the head and neck was performed using the standard protocol during bolus administration of intravenous contrast. Multiplanar CT image reconstructions and MIPs were obtained to evaluate the vascular anatomy. Carotid stenosis measurements (when applicable) are obtained utilizing NASCET criteria, using the distal internal carotid diameter as the denominator. CONTRAST:  3mL OMNIPAQUE IOHEXOL 350 MG/ML SOLN COMPARISON:  Prior noncontrast CT from 05/27/2020. FINDINGS: CTA NECK  FINDINGS Aortic arch: Visualized aortic arch normal caliber with normal branch pattern. Mild atheromatous change about the arch and origin of the great vessels without hemodynamically significant stenosis. Right carotid system: Right common and internal carotid arteries widely patent without stenosis, dissection or occlusion. Minimal atheromatous plaque about the right bifurcation without stenosis. Left carotid system: Left CCA patent from its origin to the bifurcation without stenosis. 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 Left ICA patent distally without stenosis, dissection or occlusion.  Vertebral arteries: Both vertebral arteries arise from the subclavian arteries. No proximal subclavian artery stenosis. Vertebral arteries patent within the neck without stenosis, dissection or occlusion. Skeleton: No visible acute osseous finding. No discrete or worrisome osseous lesions. Other neck: No other acute soft tissue abnormality within the neck. No mass or adenopathy. Upper chest: Advanced emphysema. Superimposed scattered patchy opacities within the posterior left greater than right lungs, could reflect atelectasis or infiltrates. Review of the MIP images confirms the above findings CTA HEAD FINDINGS Anterior circulation: Petrous segments widely patent bilaterally. Minimal atheromatous change within the carotid siphons without significant stenosis or other abnormality. A1 segments widely patent. Normal anterior communicating artery complex. Anterior cerebral arteries patent to their distal aspects without stenosis. No M1 stenosis or occlusion. Right M1 bifurcates early. Bifurcations M cells within normal limits. Distal MCA branches well perfused and symmetric. Posterior circulation: Both V4 segments patent to the vertebrobasilar junction without stenosis. Both PICA origins patent and normal. Basilar patent to its distal aspect without stenosis.  Superior cerebellar arteries patent bilaterally. Both PCAs primarily supplied via the basilar well perfused or distal aspects. Small right posterior communicating artery noted. Venous sinuses: Normal enhancement seen throughout the superior sagittal sinus to the torcula. Torcula itself appears patent. Transverse and sigmoid sinuses are patent as are the visualized proximal internal jugular veins. Straight sinus, vein of Galen, internal cerebral veins, and basal veins of Rosenthal appear patent. No evidence for dural sinus thrombosis. Cavernous sinus appears patent. Superior orbital veins symmetric and within normal limits. Anatomic variants: None significant. No aneurysm or other vascular abnormality. Review of the MIP images confirms the above findings IMPRESSION: 1. Negative CTA for large vessel occlusion. No high-grade or correctable stenosis within the major arterial vasculature of the head and neck. No aneurysm. 2. 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 3. Negative CT venogram.  No evidence for dural sinus thrombosis. 4. Emphysema (ICD10-J43.9). Superimposed patchy opacities within the posterior left greater than right lungs could reflect atelectasis or infiltrates. Correlation with physical exam and any potential symptomatology recommended. Electronically Signed   By: Jeannine Boga M.D.   On: 05/28/2020 04:07   DG FLUORO GUIDE LUMBAR PUNCTURE  Result Date: 05/29/2020 CLINICAL DATA:  Headache EXAM: DIAGNOSTIC LUMBAR PUNCTURE UNDER FLUOROSCOPIC GUIDANCE COMPARISON:  Head CT 05/27/2020 FLUOROSCOPY TIME:  Fluoroscopy Time:  20 seconds Radiation Exposure Index (if provided by the fluoroscopic device): 1.1 mGy Number of Acquired Spot Images: 0 PROCEDURE: Informed consent was obtained from the patient prior to the procedure, including potential complications of headache, allergy, and pain. With the patient prone, the lower back was  prepped with Betadine. 1% Lidocaine was used for local anesthesia. Lumbar puncture was performed at the L2-3 level using a 20 gauge gauge needle with return of clear CSF with an opening pressure of 35 cm water. Twelve ml of CSF were obtained for laboratory studies. The patient tolerated the procedure well and there were no apparent complications. IMPRESSION: Technically successful lumbar puncture under fluoroscopic guidance as above. Electronically Signed   By: Rolm Baptise M.D.   On: 05/29/2020 16:14    Orson Eva, DO  [facility]  If 7PM-7AM, please contact night-coverage [email or web address] Password [email or web address] 06/02/2020, 11:50 AM   LOS: 5 days

## 2020-06-03 LAB — COMPREHENSIVE METABOLIC PANEL WITH GFR
ALT: 47 U/L — ABNORMAL HIGH (ref 0–44)
AST: 20 U/L (ref 15–41)
Albumin: 2.7 g/dL — ABNORMAL LOW (ref 3.5–5.0)
Alkaline Phosphatase: 101 U/L (ref 38–126)
Anion gap: 8 (ref 5–15)
BUN: 14 mg/dL (ref 8–23)
CO2: 27 mmol/L (ref 22–32)
Calcium: 8.9 mg/dL (ref 8.9–10.3)
Chloride: 104 mmol/L (ref 98–111)
Creatinine, Ser: 0.57 mg/dL (ref 0.44–1.00)
GFR, Estimated: >60 mL/min
Glucose, Bld: 126 mg/dL — ABNORMAL HIGH (ref 70–99)
Potassium: 4.3 mmol/L (ref 3.5–5.1)
Sodium: 139 mmol/L (ref 135–145)
Total Bilirubin: 0.4 mg/dL (ref 0.3–1.2)
Total Protein: 5.5 g/dL — ABNORMAL LOW (ref 6.5–8.1)

## 2020-06-03 LAB — MAGNESIUM: Magnesium: 1.7 mg/dL (ref 1.7–2.4)

## 2020-06-03 LAB — LYME DISEASE DNA BY PCR(BORRELIA BURG): Lyme Disease(B.burgdorferi)PCR: NEGATIVE

## 2020-06-03 MED ORDER — DOXYCYCLINE HYCLATE 100 MG PO TABS: 100.0000 mg | ORAL_TABLET | Freq: Two times a day (BID) | ORAL | 8 refills | Status: DC

## 2020-06-03 MED ORDER — HYDROCODONE-ACETAMINOPHEN 5-325 MG PO TABS: 1.0000 | ORAL_TABLET | Freq: Four times a day (QID) | ORAL | 0 refills | Status: AC | PRN

## 2020-06-03 MED ORDER — MAGNESIUM OXIDE -MG SUPPLEMENT 400 (240 MG) MG PO TABS
400.0000 mg | ORAL_TABLET | Freq: Every day | ORAL | Status: DC
  Administered 2020-06-03: 400 mg via ORAL
  Filled 2020-06-03: qty 1

## 2020-06-03 MED ORDER — DEXAMETHASONE 2 MG PO TABS: 6.0000 mg | ORAL_TABLET | Freq: Two times a day (BID) | ORAL | 0 refills | Status: DC

## 2020-06-03 MED ORDER — MAGNESIUM OXIDE -MG SUPPLEMENT 400 (240 MG) MG PO TABS: 400.0000 mg | ORAL_TABLET | Freq: Every day | ORAL | 0 refills | Status: DC

## 2020-06-03 NOTE — Progress Notes (Signed)
Holding .9 % NaCl with KCl 20 mEq/ L infusion for results of am labs per Potassium on last drawn 06/02/20 @0418  4.4.. Will continue to monitor. Awaiting orders.

## 2020-06-03 NOTE — TOC Transition Note (Signed)
Transition of Care [facility]) - CM/SW Discharge Note   Patient Details  Name: Angela Mullins MRN: [id number] Date of Birth: [birth year]  Transition of Care [facility]) CM/SW Contact:  Natasha Bence, LCSW Phone Number: 06/03/2020, 11:21 AM   Clinical Narrative:    CSW notified of patient's readiness for discharge. CSW contacted Corene Cornea with [facility] to provide [facility] services. Corene Cornea agreeable to provide services upon discharge. TOC signing off.   Final next level of care: [place] Barriers to Discharge: Barriers Resolved   Patient Goals and CMS Choice Patient states their goals for this hospitalization and ongoing recovery are:: Return home with [facility] CMS Medicare.gov Compare Post Acute Care list provided to:: Patient Choice offered to / list presented to : Patient  Discharge Placement                    Patient and family notified of of transfer: 06/03/20  Discharge Plan and Services                DME Arranged: Other see comment ([place]) DME Agency: [employer] Date DME Agency Contacted: 06/03/20 Time DME Agency Contacted: 1120 Representative spoke with at DME Agency: Rep [place]: PT [place]: [place] ([place]) Date [place]: 06/03/20 Time [place]: 1121 Representative spoke with at [place]: Corene Cornea  Social Determinants of Health ([place]) Interventions     Readmission Risk Interventions Readmission Risk Prevention Plan 06/01/2020  Medication Screening Complete  Transportation Screening Complete

## 2020-06-03 NOTE — Discharge Summary (Signed)
Physician Discharge Summary  Angela Mullins QIH:[id number] DOB: [birth year] DOA: 05/27/2020  PCP: Coolidge Breeze, FNP  Admit date: 05/27/2020 Discharge date: 06/03/2020  Admitted From: Home Disposition:  Home   Recommendations for Outpatient Follow-up:  1. Follow up with PCP in 1-2 weeks 2. Please obtain BMP/CBC in one week   Home Health: Yes Equipment/Devices: HHPT  Discharge Condition: Stable CODE STATUS: FULL Diet recommendation: Heart Healthy   Brief/Interim Summary: 65 y.o.femalewith medical history significant ofanxiety, GERD, hyperlipidemia, hypertension, vitamin D deficiency who is coming to the emergency department due to headache, similar to her migraines, but more intense than usual that has not responded to Relpax for the past 48 hours associated with nausea, vomiting and generalized weakness. Her husband also stated in the ED that her mentation has been decreased. She had a colonoscopy on Tuesday with some polypectomies, but denies abdominal pain, diarrhea, constipation, melena hematochezia.. Wednesday was uneventful, but then developed symptoms of Thursday. EMS was called, but subsequently they decided not to come to the ED. No history of fever at home, but was febrile at 101 F in the emergency department. No sore throat, cough, dyspnea, wheezing or hemoptysis. No chest pain, palpitations, lower extremity edema. No dysuria, frequency or hematuria.  Patient was started on empiric vanco and cefepime and acyclovir initially.  She ultimately underwent LP on 05/29/20 with results noted below.    ED Course:Initial vital signs were temperature98.5 F, pulse 99, respirations 17, BP 170/93 mmHg O2 sat 98% on room air. The patient received acetaminophen 650 mg p.o., 2000 mL of LR bolus, Zofran 4 mg IVP, hydromorphone 1 mg IVP x2, cefepime, metronidazole and vancomycin  Discharge Diagnoses:  Aseptic Meningitis -cannot completely r/o septic meningitis as patient had about  2 days abx prior to LP -continued IVF -CSF cultures remain neg -cryptococcus antigen neg -HSV PCR--neg -continue empiric vanco and ceftriaxone--finish 7 day course -Discontinue acyclovir -d/c dexamethasone initially resulted in worsen headache--discussed with neurology who recommended restart steroid-->head improving again -05/29/20 LP showed WBC 318, 69 RBC, gluc 29, pro 97 -lyme and RMSF serology-->NEG -Ehrlichia serology--NEG -06/01/20--frontal headache slight worse-->toradol, bolus IVF, norco, steroids restarted per neurology -06/02/20--headache still present but slowly improving -CTA head and neck--Negative CTA for large vessel occlusion. No high-grade or correctable stenosis within the major arterial vasculature of the head and neck. No aneurysm -MRI brain--no abnormal areas on contrast enhancement -patient finished 7 days of IV antibiotics -d/c home with 9 more days doxycycline to complete 14 day course -case discussed with neurology--ok to go home with dexamethasone taper -Rx Norco 5/325 #20 at time of d/c  Acute respiratory failure with hypoxia -initially placed on 4L>>2L>>RA -due to COPD exacerbation -started duoneb -started pulmicort  Hyperlipidemia -continue statin and fenofibrate  HTN -continue lisinopril  GERD -continue PPI  Hyperglycemia -check A1C--5.7  Insect bite -pt endorses insect bite about 2 weeks prior to admit -await serologies--Lyme and RMSF--neg -Ehrlichia--neg -no signs of cellulitis or necrotic infection on abd wall site  Hypokalemia/Hypomagnesemia -repleted -Rx mag ox 400 mg daily  Deconditioning -PT eval-->HHPT    Discharge Instructions   Allergies as of 06/03/2020      Reactions   Demerol [meperidine Hcl] Other (See Comments)   Does not help   Quinine Derivatives Nausea And Vomiting      Medication List    STOP taking these medications   Halog 0.1 % Oint Generic drug: Halcinonide   Potassium 99 MG Tabs     TAKE  these medications   aspirin  EC 81 MG tablet Take 81 mg by mouth daily. Swallow whole.   atorvastatin 80 MG tablet Commonly known as: LIPITOR Take 80 mg by mouth daily.   cetirizine 10 MG tablet Commonly known as: ZYRTEC Take 10 mg by mouth daily.   clonazePAM 0.5 MG tablet Commonly known as: KLONOPIN Take 0.5 mg by mouth 2 (two) times daily.   dexamethasone 2 MG tablet Commonly known as: DECADRON Take 3 tablets (6 mg total) by mouth 2 (two) times daily. X 3 days, then 2 tabs twice daily x 3 days, then 1 tab twice a day x 3 days, then 1 tab daily x 3 days   diphenhydrAMINE 25 mg capsule Commonly known as: BENADRYL Take 25 mg by mouth every 8 (eight) hours as needed for itching (Swelling).   doxycycline 100 MG tablet Commonly known as: VIBRA-TABS Take 1 tablet (100 mg total) by mouth every 12 (twelve) hours.   eletriptan 40 MG tablet Commonly known as: RELPAX Take 40 mg by mouth every 2 (two) hours as needed for migraine.   ergocalciferol 1.25 MG (50000 UT) capsule Commonly known as: VITAMIN D2 Take 50,000 Units by mouth once a week.   fenofibrate micronized 200 MG capsule Commonly known as: LOFIBRA Take 200 mg by mouth daily before breakfast.   HYDROcodone-acetaminophen 5-325 MG tablet Commonly known as: NORCO/VICODIN Take 1 tablet by mouth every 6 (six) hours as needed for moderate pain.   ibuprofen 200 MG tablet Commonly known as: ADVIL Take 400 mg by mouth daily as needed for headache.   lisinopril 10 MG tablet Commonly known as: ZESTRIL Take 10 mg by mouth daily.   magnesium oxide 400 (240 Mg) MG tablet Commonly known as: MAG-OX Take 1 tablet (400 mg total) by mouth daily.   multivitamin with minerals tablet Take 1 tablet by mouth daily. 50 +   omeprazole 40 MG capsule Commonly known as: PRILOSEC Take 40 mg by mouth 2 (two) times daily.            Durable Medical Equipment  (From admission, onward)         Start     Ordered   06/01/20 1335   For home use only DME Walker rolling  Once       Question Answer Comment  Walker: With 5 Inch Wheels   Patient needs a walker to treat with the following condition Gait instability      06/01/20 1334          Follow-up Information    [facility]   Specialty: Home Health Services Why: PT will call to make first home visit appointment.       [facility] Follow up.   Why:  RW             Allergies  Allergen Reactions  . Demerol [Meperidine Hcl] Other (See Comments)    Does not help  . Quinine Derivatives Nausea And Vomiting    Consultations:  neurology   Procedures/Studies: CT Abdomen Pelvis Wo Contrast  Result Date: 05/27/2020 CLINICAL DATA:  Abdominal pain and fever EXAM: CT ABDOMEN AND PELVIS WITHOUT CONTRAST TECHNIQUE: Multidetector CT imaging of the abdomen and pelvis was performed following the standard protocol without IV contrast. COMPARISON:  CT February 17, 2008 FINDINGS: Lower chest: Bibasilar atelectasis/scarring. Normal size heart. No significant pericardial effusion/thickening. Small hiatal hernia. Hepatobiliary: Unremarkable noncontrast appearance of the hepatic parenchyma. Gallbladder is surgically absent. No biliary ductal dilation. Pancreas: Mild retroperitoneal/peripancreatic fat stranding. No  pancreatic ductal dilation. Spleen: Within normal limits Adrenals/Urinary Tract: Mild apparent thickening of the the bilateral adrenal glands with adjacent inflammatory stranding. No hydronephrosis. No nephrolithiasis. No contour deforming renal masses. The urinary bladder is grossly unremarkable for degree of distension. Stomach/Bowel: Small hiatal hernia otherwise the stomach is grossly unremarkable for degree of distension. No pathologic dilation of small bowel. Tiny appendicoliths in a non appendix. No suspicious colonic wall thickening or mass like lesions. Scattered colonic diverticulosis without findings of acute diverticulitis.  Vascular/Lymphatic: Aortic atherosclerosis. No enlarged abdominal or pelvic lymph nodes. Reproductive: Uterus and bilateral adnexa are unremarkable. Other: No pneumoperitoneum. Musculoskeletal: Multilevel degenerative changes spine. No acute osseous abnormality. IMPRESSION: 1. Mild retroperitoneal/peripancreatic fat stranding nonspecific but possibly representing acute pancreatitis. Correlation with serum lipase is recommended. 2. Mild apparent thickening of the bilateral adrenal glands with adjacent inflammatory stranding, which is nonspecific but may represent adrenal congestion. 3. Colonic diverticulosis without findings of acute diverticulitis. 4. Small hiatal hernia. 5. Aortic atherosclerosis. Aortic Atherosclerosis (ICD10-I70.0). Electronically Signed   By: Dahlia Bailiff MD   On: 05/27/2020 22:01   CT Angio Head W or Wo Contrast  Result Date: 05/28/2020 CLINICAL DATA:  Initial evaluation for acute headache. EXAM: CT ANGIOGRAPHY HEAD AND NECK CT VENOGRAM OF THE HEAD WITH CONTRAST. TECHNIQUE: Multidetector CT imaging of the head and neck was performed using the standard protocol during bolus administration of intravenous contrast. Multiplanar CT image reconstructions and MIPs were obtained to evaluate the vascular anatomy. Carotid stenosis measurements (when applicable) are obtained utilizing NASCET criteria, using the distal internal carotid diameter as the denominator. CONTRAST:  78mL OMNIPAQUE IOHEXOL 350 MG/ML SOLN COMPARISON:  Prior noncontrast CT from 05/27/2020. FINDINGS: CTA NECK FINDINGS Aortic arch: Visualized aortic arch normal caliber with normal branch pattern. Mild atheromatous change about the arch and origin of the great vessels without hemodynamically significant stenosis. Right carotid system: Right common and internal carotid arteries widely patent without stenosis, dissection or occlusion. Minimal atheromatous plaque about the right bifurcation without stenosis. Left carotid system: Left  CCA patent from its origin to the bifurcation without stenosis. 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 Left ICA patent distally without stenosis, dissection or occlusion. Vertebral arteries: Both vertebral arteries arise from the subclavian arteries. No proximal subclavian artery stenosis. Vertebral arteries patent within the neck without stenosis, dissection or occlusion. Skeleton: No visible acute osseous finding. No discrete or worrisome osseous lesions. Other neck: No other acute soft tissue abnormality within the neck. No mass or adenopathy. Upper chest: Advanced emphysema. Superimposed scattered patchy opacities within the posterior left greater than right lungs, could reflect atelectasis or infiltrates. Review of the MIP images confirms the above findings CTA HEAD FINDINGS Anterior circulation: Petrous segments widely patent bilaterally. Minimal atheromatous change within the carotid siphons without significant stenosis or other abnormality. A1 segments widely patent. Normal anterior communicating artery complex. Anterior cerebral arteries patent to their distal aspects without stenosis. No M1 stenosis or occlusion. Right M1 bifurcates early. Bifurcations M cells within normal limits. Distal MCA branches well perfused and symmetric. Posterior circulation: Both V4 segments patent to the vertebrobasilar junction without stenosis. Both PICA origins patent and normal. Basilar patent to its distal aspect without stenosis. Superior cerebellar arteries patent bilaterally. Both PCAs primarily supplied via the basilar well perfused or distal aspects. Small right posterior communicating artery noted. Venous sinuses: Normal enhancement seen throughout the superior sagittal sinus to the torcula. Torcula itself appears patent. Transverse and  sigmoid sinuses are patent as are the visualized proximal internal jugular veins. Straight sinus, vein of Galen,  internal cerebral veins, and basal veins of Rosenthal appear patent. No evidence for dural sinus thrombosis. Cavernous sinus appears patent. Superior orbital veins symmetric and within normal limits. Anatomic variants: None significant. No aneurysm or other vascular abnormality. Review of the MIP images confirms the above findings IMPRESSION: 1. Negative CTA for large vessel occlusion. No high-grade or correctable stenosis within the major arterial vasculature of the head and neck. No aneurysm. 2. 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 3. Negative CT venogram.  No evidence for dural sinus thrombosis. 4. Emphysema (ICD10-J43.9). Superimposed patchy opacities within the posterior left greater than right lungs could reflect atelectasis or infiltrates. Correlation with physical exam and any potential symptomatology recommended. Electronically Signed   By: Jeannine Boga M.D.   On: 05/28/2020 04:07   CT HEAD WO CONTRAST  Result Date: 05/27/2020 CLINICAL DATA:  Headache.  Dysarthria EXAM: CT HEAD WITHOUT CONTRAST TECHNIQUE: Contiguous axial images were obtained from the base of the skull through the vertex without intravenous contrast. COMPARISON:  None. FINDINGS: Brain: Ventricles and sulci are normal in size and configuration. There is no intracranial mass, hemorrhage, extra-axial fluid collection, or midline shift. Brain parenchyma appears unremarkable. No evident acute infarct. Vascular: No hyperdense vessel.  No evident vascular calcification. Skull: Bony calvarium appears intact. Sinuses/Orbits: There are small retention cysts in each inferior maxillary antrum. There is a small retention cyst in a posterior right ethmoid air cell. There is rightward deviation of the nasal septum. Orbits appear symmetric bilaterally. Other: Mastoid air cells are clear. There is debris in the right external auditory canal. IMPRESSION: Normal appearing  brain parenchyma. No acute infarct. No mass or hemorrhage. Foci of mild paranasal sinus disease. Deviated nasal septum. Probable cerumen in the right external auditory canal. Electronically Signed   By: Lowella Grip III M.D.   On: 05/27/2020 20:00   CT Angio Neck W and/or Wo Contrast  Result Date: 05/28/2020 CLINICAL DATA:  Initial evaluation for acute headache. EXAM: CT ANGIOGRAPHY HEAD AND NECK CT VENOGRAM OF THE HEAD WITH CONTRAST. TECHNIQUE: Multidetector CT imaging of the head and neck was performed using the standard protocol during bolus administration of intravenous contrast. Multiplanar CT image reconstructions and MIPs were obtained to evaluate the vascular anatomy. Carotid stenosis measurements (when applicable) are obtained utilizing NASCET criteria, using the distal internal carotid diameter as the denominator. CONTRAST:  24mL OMNIPAQUE IOHEXOL 350 MG/ML SOLN COMPARISON:  Prior noncontrast CT from 05/27/2020. FINDINGS: CTA NECK FINDINGS Aortic arch: Visualized aortic arch normal caliber with normal branch pattern. Mild atheromatous change about the arch and origin of the great vessels without hemodynamically significant stenosis. Right carotid system: Right common and internal carotid arteries widely patent without stenosis, dissection or occlusion. Minimal atheromatous plaque about the right bifurcation without stenosis. Left carotid system: Left CCA patent from its origin to the bifurcation without stenosis. 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 Left ICA patent distally without stenosis, dissection or occlusion. Vertebral arteries: Both vertebral arteries arise from the subclavian arteries. No proximal subclavian artery stenosis. Vertebral arteries patent within the neck without stenosis, dissection or occlusion. Skeleton: No visible acute osseous finding. No discrete or worrisome osseous lesions. Other neck: No other  acute soft tissue abnormality within the neck. No mass or adenopathy. Upper chest:  Advanced emphysema. Superimposed scattered patchy opacities within the posterior left greater than right lungs, could reflect atelectasis or infiltrates. Review of the MIP images confirms the above findings CTA HEAD FINDINGS Anterior circulation: Petrous segments widely patent bilaterally. Minimal atheromatous change within the carotid siphons without significant stenosis or other abnormality. A1 segments widely patent. Normal anterior communicating artery complex. Anterior cerebral arteries patent to their distal aspects without stenosis. No M1 stenosis or occlusion. Right M1 bifurcates early. Bifurcations M cells within normal limits. Distal MCA branches well perfused and symmetric. Posterior circulation: Both V4 segments patent to the vertebrobasilar junction without stenosis. Both PICA origins patent and normal. Basilar patent to its distal aspect without stenosis. Superior cerebellar arteries patent bilaterally. Both PCAs primarily supplied via the basilar well perfused or distal aspects. Small right posterior communicating artery noted. Venous sinuses: Normal enhancement seen throughout the superior sagittal sinus to the torcula. Torcula itself appears patent. Transverse and sigmoid sinuses are patent as are the visualized proximal internal jugular veins. Straight sinus, vein of Galen, internal cerebral veins, and basal veins of Rosenthal appear patent. No evidence for dural sinus thrombosis. Cavernous sinus appears patent. Superior orbital veins symmetric and within normal limits. Anatomic variants: None significant. No aneurysm or other vascular abnormality. Review of the MIP images confirms the above findings IMPRESSION: 1. Negative CTA for large vessel occlusion. No high-grade or correctable stenosis within the major arterial vasculature of the head and neck. No aneurysm. 2. Bulky calcified plaque about the left carotid bulb  with associated stenosis of up to 40% by NASCET criteria. 3. Negative CT venogram.  No evidence for dural sinus thrombosis. 4. Emphysema (ICD10-J43.9). Superimposed patchy opacities within the posterior left greater than right lungs could reflect atelectasis or infiltrates. Correlation with physical exam and any potential symptomatology recommended. Electronically Signed   By: Jeannine Boga M.D.   On: 05/28/2020 04:07   MR BRAIN W WO CONTRAST  Result Date: 05/31/2020 CLINICAL DATA:  Sarcoidosis; encephalitis. EXAM: MRI HEAD WITHOUT AND WITH CONTRAST TECHNIQUE: Multiplanar, multiecho pulse sequences of the brain and surrounding structures were obtained without and with intravenous contrast. CONTRAST:  44mL GADAVIST GADOBUTROL 1 MMOL/ML IV SOLN COMPARISON:  Head CT May 27, 2020 FINDINGS: Brain: No acute infarction, hemorrhage, hydrocephalus, extra-axial collection or mass lesion. Rare foci of T2 hyperintensity are seen within white matter of the cerebral hemispheres, nonspecific. No focus of abnormal contrast enhancement Vascular: Normal flow voids. Skull and upper cervical spine: Normal marrow signal. Sinuses/Orbits: Small mucous retention cyst in the bilateral maxillary sinuses. The orbits maintained. Other: Mild mucosal thickening of the right mastoid cells. IMPRESSION: Minimal amount of nonspecific T2 hyperintense lesions the white matter, may represent early microvascular ischemic changes. No focus of abnormal contrast enhancement. Electronically Signed   By: Pedro Earls M.D.   On: 05/31/2020 10:39   DG Chest Port 1 View  Result Date: 05/27/2020 CLINICAL DATA:  Questionable sepsis - evaluate for abnormality EXAM: PORTABLE CHEST 1 VIEW COMPARISON:  Radiograph 01/28/2006 FINDINGS: The cardiomediastinal contours are normal. Chronic blunting of left costophrenic angle. Mild interstitial coarsening. Pulmonary vasculature is normal. No consolidation, pleural effusion, or pneumothorax. No  acute osseous abnormalities are seen. IMPRESSION: 1. Mild interstitial coarsening of unknown acuity, likely chronic. 2. Chronic blunting of the left costophrenic angle. Electronically Signed   By: Keith Rake M.D.   On: 05/27/2020 20:41   DG Carlena Hurl SPEECH PATH  Result Date: 05/18/2020 [facility] [address]  [place], [place], [address] Phone: [phone]   Fax:  [phone] Modified Barium Swallow Patient Details Name: Angela Mullins MRN: [id number] Date of Birth: [birth year] No data recorded Encounter Date: 05/18/2020  End of Session - 05/18/20 1254   Visit Number 1   Number of Visits 1   Authorization Type [employer]   SLP Start Time [id number]   SLP Stop Time  1215   SLP Time Calculation (min) 30 min   Activity Tolerance Patient tolerated treatment well     Past Medical History: Diagnosis Date . Anxiety  . GERD (gastroesophageal reflux disease)  . Hypercholesteremia  . Hypertension  . Vitamin D deficiency  Past Surgical History: Procedure Laterality Date . CHOLECYSTECTOMY   . PELVIC FRACTURE SURGERY   . WISDOM TOOTH EXTRACTION   There were no vitals filed for this visit.  Subjective Assessment - 05/18/20 1247   Subjective "I have to throw my head back to swallow pills."   Special Tests MBSS   Currently in Pain? No/denies      General - 05/18/20 1248    General Information  Date of Onset 05/10/20   HPI Maloree Boas is a 65 yo female who was referred by Dr. Maylon Peppers for MBSS due to Pt with reports of dysphagia. Pt with PMH significant for anxiety, HTN, HLD, and GERD. Patient reports that since she was a teenager she has felt recurrent episodes of dysphagia when swallowing pills, very occasionally when swallowing solid food.  She reported that she tries to chew very well reported to avoid having any choking episodes.  However, she reported that she feels "strangled" when food goes occasionally as if there was some sort of pressure in her throat.  Does not have any  symptoms when swallowing liquids.  More recently, for the last year she has presented episodes of feeling a foreign body in her throat as "if something was lodged in her throat".  She constantly clears her throat but reports no phlegm comes out. She quit smoking last year. Her Prilosec was increased to 40 mg twice per day last week. She does not sleep with the HOB elevated, but uses a wedge. No recent reports of PNA.   Type of Study MBS-Modified Barium Swallow Study   Previous Swallow Assessment None on record   Diet Prior to this Study Regular;Thin liquids   Temperature Spikes Noted No   Respiratory Status Room air   History of Recent Intubation No   Behavior/Cognition Alert;Cooperative;Pleasant mood   Oral Cavity Assessment Within Functional Limits   Oral Care Completed by SLP No   Oral Cavity - Dentition Adequate natural dentition   Vision Functional for self feeding   Self-Feeding Abilities Able to feed self   Patient Positioning Upright in chair   Baseline Vocal Quality Normal   Volitional Cough Strong   Volitional Swallow Able to elicit   Anatomy Within functional limits   Pharyngeal Secretions Not observed secondary MBS      Oral Preparation/Oral Phase - 05/18/20 1248    Oral Preparation/Oral Phase  Oral Phase Impaired    Oral - Thin  Oral - Thin Teaspoon Within functional limits   Oral - Thin Cup Within functional limits    Oral - Solids  Oral - Puree Within functional limits   Oral - Regular Within functional limits   Oral - Pill Within functional limits    Electrical stimulation - Oral Phase  Was Electrical Stimulation Used No      Pharyngeal Phase - 05/18/20 1250  Pharyngeal Phase  Pharyngeal Phase Within functional limits      Cricopharyngeal Phase - 05/18/20 1250    Cervical Esophageal Phase  Cervical Esophageal Phase Impaired    Cervical Esophageal Phase - Comment  Cervical Esophageal Comment Pt points to ~C7 as source of globus with solids and pill   Other Esophageal Phase Observations Pt with  delayed esophageal transit, incomplete clearance with primary wave, stasis of pill near LES which cleared with puree wash (did not clear with thin wash)      Plan - 05/18/20 1254   Clinical Impression Statement MBSS completed. Pt assessed with barium tinged thin, puree, regular textures, and barium tablet. Oropharyngeal swallow is WNL with the exception of Pt producing effortful swallows with solids, however suspect this is due to esophageal component. Swallow initiation was timely and hyolaryngeal excursion WNL, no penetration/aspiration or significant pharyngeal residuals post swallow. Esophageal sweep completed before and after presentation of barium table revealed incomplete clearance of barium via primary wave. The barium tablet became transiently delayed near the LES and was not cleared by liquid wash, but was cleared with a bite of puree. Pt reports that she typically takes larger pills balled up into small pieces of bread to facilitate swallow. Pt's reports of globus, effortful swallows, non productive coughing, and regurgitation appear more consistent with esophageal dysphagia. She was recently increased to prilosec 2x per day. Pt was encouraged to alternate solids and liquids, adhere to reflux precautions, and elevate the head of her bed. No further SLP services indicated at this time. Pt has EGD scheduled for Tuesday with Dr. Jenetta Downer.   Consulted and Agree with Plan of Care Patient     Patient will benefit from skilled therapeutic intervention in order to improve the following deficits and impairments:  Dysphagia, oropharyngeal phase  Recommendations/Treatment - 05/18/20 1252    Swallow Evaluation Recommendations  Recommended Consults Consider esophageal assessment   SLP Diet Recommendations Age appropriate regular;Thin   Liquid Administration via Cup   Medication Administration Whole meds with liquid   Supervision Patient able to self feed   Compensations Follow solids with liquid   Postural Changes  Seated upright at 90 degrees;Remain upright for at least 30 minutes after feeds/meals      Prognosis - 05/18/20 1253    Prognosis  Prognosis for Safe Diet Advancement Good   Barriers/Prognosis Comment suspected primary  esophageal dysphagia    Individuals Consulted  Consulted and Agree with Results and Recommendations Patient   Report Sent to  Referring physician     Problem List Patient Active Problem List  Diagnosis Date Noted . Dysphagia 05/10/2020 . Throat discomfort 05/10/2020 . GERD (gastroesophageal reflux disease) 05/10/2020 Thank you, Genene Churn, [place] 05/18/2020, 1:01 PM [place] [address] [place], [place], [address] Phone: [phone]   Fax:  [phone] Name: Angela Mullins MRN: [id number] Date of Birth: 24-Dec-1955  CT VENOGRAM HEAD  Result Date: 05/28/2020 CLINICAL DATA:  Initial evaluation for acute headache. EXAM: CT ANGIOGRAPHY HEAD AND NECK CT VENOGRAM OF THE HEAD WITH CONTRAST. TECHNIQUE: Multidetector CT imaging of the head and neck was performed using the standard protocol during bolus administration of intravenous contrast. Multiplanar CT image reconstructions and MIPs were obtained to evaluate the vascular anatomy. Carotid stenosis measurements (when applicable) are obtained utilizing NASCET criteria, using the distal internal carotid diameter as the denominator. CONTRAST:  16mL OMNIPAQUE IOHEXOL 350 MG/ML SOLN COMPARISON:  Prior noncontrast CT from 05/27/2020. FINDINGS: CTA NECK FINDINGS Aortic  arch: Visualized aortic arch normal caliber with normal branch pattern. Mild atheromatous change about the arch and origin of the great vessels without hemodynamically significant stenosis. Right carotid system: Right common and internal carotid arteries widely patent without stenosis, dissection or occlusion. Minimal atheromatous plaque about the right bifurcation without stenosis. Left carotid system: Left CCA patent from  its origin to the bifurcation without stenosis. 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 Left ICA patent distally without stenosis, dissection or occlusion. Vertebral arteries: Both vertebral arteries arise from the subclavian arteries. No proximal subclavian artery stenosis. Vertebral arteries patent within the neck without stenosis, dissection or occlusion. Skeleton: No visible acute osseous finding. No discrete or worrisome osseous lesions. Other neck: No other acute soft tissue abnormality within the neck. No mass or adenopathy. Upper chest: Advanced emphysema. Superimposed scattered patchy opacities within the posterior left greater than right lungs, could reflect atelectasis or infiltrates. Review of the MIP images confirms the above findings CTA HEAD FINDINGS Anterior circulation: Petrous segments widely patent bilaterally. Minimal atheromatous change within the carotid siphons without significant stenosis or other abnormality. A1 segments widely patent. Normal anterior communicating artery complex. Anterior cerebral arteries patent to their distal aspects without stenosis. No M1 stenosis or occlusion. Right M1 bifurcates early. Bifurcations M cells within normal limits. Distal MCA branches well perfused and symmetric. Posterior circulation: Both V4 segments patent to the vertebrobasilar junction without stenosis. Both PICA origins patent and normal. Basilar patent to its distal aspect without stenosis. Superior cerebellar arteries patent bilaterally. Both PCAs primarily supplied via the basilar well perfused or distal aspects. Small right posterior communicating artery noted. Venous sinuses: Normal enhancement seen throughout the superior sagittal sinus to the torcula. Torcula itself appears patent. Transverse and sigmoid sinuses are patent as are the visualized proximal internal jugular veins. Straight sinus, vein of Galen, internal  cerebral veins, and basal veins of Rosenthal appear patent. No evidence for dural sinus thrombosis. Cavernous sinus appears patent. Superior orbital veins symmetric and within normal limits. Anatomic variants: None significant. No aneurysm or other vascular abnormality. Review of the MIP images confirms the above findings IMPRESSION: 1. Negative CTA for large vessel occlusion. No high-grade or correctable stenosis within the major arterial vasculature of the head and neck. No aneurysm. 2. 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 3. Negative CT venogram.  No evidence for dural sinus thrombosis. 4. Emphysema (ICD10-J43.9). Superimposed patchy opacities within the posterior left greater than right lungs could reflect atelectasis or infiltrates. Correlation with physical exam and any potential symptomatology recommended. Electronically Signed   By: Jeannine Boga M.D.   On: 05/28/2020 04:07   DG FLUORO GUIDE LUMBAR PUNCTURE  Result Date: 05/29/2020 CLINICAL DATA:  Headache EXAM: DIAGNOSTIC LUMBAR PUNCTURE UNDER FLUOROSCOPIC GUIDANCE COMPARISON:  Head CT 05/27/2020 FLUOROSCOPY TIME:  Fluoroscopy Time:  20 seconds Radiation Exposure Index (if provided by the fluoroscopic device): 1.1 mGy Number of Acquired Spot Images: 0 PROCEDURE: Informed consent was obtained from the patient prior to the procedure, including potential complications of headache, allergy, and pain. With the patient prone, the lower back was prepped with Betadine. 1% Lidocaine was used for local anesthesia. Lumbar puncture was performed at the L2-3 level using a 20 gauge gauge needle with return of clear CSF with an opening pressure of 35 cm water. Twelve ml of CSF were obtained for laboratory studies. The patient tolerated the  procedure well and there were no apparent complications. IMPRESSION: Technically successful lumbar puncture under fluoroscopic guidance as above.  Electronically Signed   By: Rolm Baptise M.D.   On: 05/29/2020 16:14        Discharge Exam: Vitals:   06/02/20 2119 06/03/20 0542  BP: (!) 167/78 (!) 162/93  Pulse: 67 (!) 58  Resp: 19 17  Temp: 97.7 F (36.5 C) 98 F (36.7 C)  SpO2: 97% 95%   Vitals:   06/02/20 0503 06/02/20 1349 06/02/20 2119 06/03/20 0542  BP: (!) 154/99 (!) 153/87 (!) 167/78 (!) 162/93  Pulse: 63 79 67 (!) 58  Resp: 18 20 19 17   Temp: 98.1 F (36.7 C) 98.1 F (36.7 C) 97.7 F (36.5 C) 98 F (36.7 C)  TempSrc: Oral Oral Oral   SpO2: 93% 96% 97% 95%  Weight:      Height:        General: Pt is alert, awake, not in acute distress Cardiovascular: RRR, S1/S2 +, no rubs, no gallops Respiratory: diminshed but CTA bilaterally, no wheezing, no rhonchi Abdominal: Soft, NT, ND, bowel sounds + Extremities: no edema, no cyanosis Neuro:  CN II-XII intact, strength 4/5 in RUE, RLE, strength 4/5 LUE, LLE; sensation intact bilateral; no dysmetria; babinski equivocal    The results of significant diagnostics from this hospitalization (including imaging, microbiology, ancillary and laboratory) are listed below for reference.    Significant Diagnostic Studies: CT Abdomen Pelvis Wo Contrast  Result Date: 05/27/2020 CLINICAL DATA:  Abdominal pain and fever EXAM: CT ABDOMEN AND PELVIS WITHOUT CONTRAST TECHNIQUE: Multidetector CT imaging of the abdomen and pelvis was performed following the standard protocol without IV contrast. COMPARISON:  CT February 17, 2008 FINDINGS: Lower chest: Bibasilar atelectasis/scarring. Normal size heart. No significant pericardial effusion/thickening. Small hiatal hernia. Hepatobiliary: Unremarkable noncontrast appearance of the hepatic parenchyma. Gallbladder is surgically absent. No biliary ductal dilation. Pancreas: Mild retroperitoneal/peripancreatic fat stranding. No pancreatic ductal dilation. Spleen: Within normal limits Adrenals/Urinary Tract: Mild apparent thickening of the the  bilateral adrenal glands with adjacent inflammatory stranding. No hydronephrosis. No nephrolithiasis. No contour deforming renal masses. The urinary bladder is grossly unremarkable for degree of distension. Stomach/Bowel: Small hiatal hernia otherwise the stomach is grossly unremarkable for degree of distension. No pathologic dilation of small bowel. Tiny appendicoliths in a non appendix. No suspicious colonic wall thickening or mass like lesions. Scattered colonic diverticulosis without findings of acute diverticulitis. Vascular/Lymphatic: Aortic atherosclerosis. No enlarged abdominal or pelvic lymph nodes. Reproductive: Uterus and bilateral adnexa are unremarkable. Other: No pneumoperitoneum. Musculoskeletal: Multilevel degenerative changes spine. No acute osseous abnormality. IMPRESSION: 1. Mild retroperitoneal/peripancreatic fat stranding nonspecific but possibly representing acute pancreatitis. Correlation with serum lipase is recommended. 2. Mild apparent thickening of the bilateral adrenal glands with adjacent inflammatory stranding, which is nonspecific but may represent adrenal congestion. 3. Colonic diverticulosis without findings of acute diverticulitis. 4. Small hiatal hernia. 5. Aortic atherosclerosis. Aortic Atherosclerosis (ICD10-I70.0). Electronically Signed   By: Dahlia Bailiff MD   On: 05/27/2020 22:01   CT Angio Head W or Wo Contrast  Result Date: 05/28/2020 CLINICAL DATA:  Initial evaluation for acute headache. EXAM: CT ANGIOGRAPHY HEAD AND NECK CT VENOGRAM OF THE HEAD WITH CONTRAST. TECHNIQUE: Multidetector CT imaging of the head and neck was performed using the standard protocol during bolus administration of intravenous contrast. Multiplanar CT image reconstructions and MIPs were obtained to evaluate the vascular anatomy. Carotid stenosis measurements (when applicable) are obtained utilizing NASCET criteria, using the distal internal carotid diameter as  the denominator. CONTRAST:  75mL  OMNIPAQUE IOHEXOL 350 MG/ML SOLN COMPARISON:  Prior noncontrast CT from 05/27/2020. FINDINGS: CTA NECK FINDINGS Aortic arch: Visualized aortic arch normal caliber with normal branch pattern. Mild atheromatous change about the arch and origin of the great vessels without hemodynamically significant stenosis. Right carotid system: Right common and internal carotid arteries widely patent without stenosis, dissection or occlusion. Minimal atheromatous plaque about the right bifurcation without stenosis. Left carotid system: Left CCA patent from its origin to the bifurcation without stenosis. 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 Left ICA patent distally without stenosis, dissection or occlusion. Vertebral arteries: Both vertebral arteries arise from the subclavian arteries. No proximal subclavian artery stenosis. Vertebral arteries patent within the neck without stenosis, dissection or occlusion. Skeleton: No visible acute osseous finding. No discrete or worrisome osseous lesions. Other neck: No other acute soft tissue abnormality within the neck. No mass or adenopathy. Upper chest: Advanced emphysema. Superimposed scattered patchy opacities within the posterior left greater than right lungs, could reflect atelectasis or infiltrates. Review of the MIP images confirms the above findings CTA HEAD FINDINGS Anterior circulation: Petrous segments widely patent bilaterally. Minimal atheromatous change within the carotid siphons without significant stenosis or other abnormality. A1 segments widely patent. Normal anterior communicating artery complex. Anterior cerebral arteries patent to their distal aspects without stenosis. No M1 stenosis or occlusion. Right M1 bifurcates early. Bifurcations M cells within normal limits. Distal MCA branches well perfused and symmetric. Posterior circulation: Both V4 segments patent to the vertebrobasilar junction without  stenosis. Both PICA origins patent and normal. Basilar patent to its distal aspect without stenosis. Superior cerebellar arteries patent bilaterally. Both PCAs primarily supplied via the basilar well perfused or distal aspects. Small right posterior communicating artery noted. Venous sinuses: Normal enhancement seen throughout the superior sagittal sinus to the torcula. Torcula itself appears patent. Transverse and sigmoid sinuses are patent as are the visualized proximal internal jugular veins. Straight sinus, vein of Galen, internal cerebral veins, and basal veins of Rosenthal appear patent. No evidence for dural sinus thrombosis. Cavernous sinus appears patent. Superior orbital veins symmetric and within normal limits. Anatomic variants: None significant. No aneurysm or other vascular abnormality. Review of the MIP images confirms the above findings IMPRESSION: 1. Negative CTA for large vessel occlusion. No high-grade or correctable stenosis within the major arterial vasculature of the head and neck. No aneurysm. 2. 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 3. Negative CT venogram.  No evidence for dural sinus thrombosis. 4. Emphysema (ICD10-J43.9). Superimposed patchy opacities within the posterior left greater than right lungs could reflect atelectasis or infiltrates. Correlation with physical exam and any potential symptomatology recommended. Electronically Signed   By: Rise MuBenjamin  McClintock M.D.   On: 05/28/2020 04:07   CT HEAD WO CONTRAST  Result Date: 05/27/2020 CLINICAL DATA:  Headache.  Dysarthria EXAM: CT HEAD WITHOUT CONTRAST TECHNIQUE: Contiguous axial images were obtained from the base of the skull through the vertex without intravenous contrast. COMPARISON:  None. FINDINGS: Brain: Ventricles and sulci are normal in size and configuration. There is no intracranial mass, hemorrhage, extra-axial fluid collection, or midline shift.  Brain parenchyma appears unremarkable. No evident acute infarct. Vascular: No hyperdense vessel.  No evident vascular calcification. Skull: Bony calvarium appears intact. Sinuses/Orbits: There are small retention cysts in each inferior maxillary antrum. There is a small retention cyst in a posterior  right ethmoid air cell. There is rightward deviation of the nasal septum. Orbits appear symmetric bilaterally. Other: Mastoid air cells are clear. There is debris in the right external auditory canal. IMPRESSION: Normal appearing brain parenchyma. No acute infarct. No mass or hemorrhage. Foci of mild paranasal sinus disease. Deviated nasal septum. Probable cerumen in the right external auditory canal. Electronically Signed   By: Lowella Grip III M.D.   On: 05/27/2020 20:00   CT Angio Neck W and/or Wo Contrast  Result Date: 05/28/2020 CLINICAL DATA:  Initial evaluation for acute headache. EXAM: CT ANGIOGRAPHY HEAD AND NECK CT VENOGRAM OF THE HEAD WITH CONTRAST. TECHNIQUE: Multidetector CT imaging of the head and neck was performed using the standard protocol during bolus administration of intravenous contrast. Multiplanar CT image reconstructions and MIPs were obtained to evaluate the vascular anatomy. Carotid stenosis measurements (when applicable) are obtained utilizing NASCET criteria, using the distal internal carotid diameter as the denominator. CONTRAST:  79mL OMNIPAQUE IOHEXOL 350 MG/ML SOLN COMPARISON:  Prior noncontrast CT from 05/27/2020. FINDINGS: CTA NECK FINDINGS Aortic arch: Visualized aortic arch normal caliber with normal branch pattern. Mild atheromatous change about the arch and origin of the great vessels without hemodynamically significant stenosis. Right carotid system: Right common and internal carotid arteries widely patent without stenosis, dissection or occlusion. Minimal atheromatous plaque about the right bifurcation without stenosis. Left carotid system: Left CCA patent from its origin  to the bifurcation without stenosis. 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 Left ICA patent distally without stenosis, dissection or occlusion. Vertebral arteries: Both vertebral arteries arise from the subclavian arteries. No proximal subclavian artery stenosis. Vertebral arteries patent within the neck without stenosis, dissection or occlusion. Skeleton: No visible acute osseous finding. No discrete or worrisome osseous lesions. Other neck: No other acute soft tissue abnormality within the neck. No mass or adenopathy. Upper chest: Advanced emphysema. Superimposed scattered patchy opacities within the posterior left greater than right lungs, could reflect atelectasis or infiltrates. Review of the MIP images confirms the above findings CTA HEAD FINDINGS Anterior circulation: Petrous segments widely patent bilaterally. Minimal atheromatous change within the carotid siphons without significant stenosis or other abnormality. A1 segments widely patent. Normal anterior communicating artery complex. Anterior cerebral arteries patent to their distal aspects without stenosis. No M1 stenosis or occlusion. Right M1 bifurcates early. Bifurcations M cells within normal limits. Distal MCA branches well perfused and symmetric. Posterior circulation: Both V4 segments patent to the vertebrobasilar junction without stenosis. Both PICA origins patent and normal. Basilar patent to its distal aspect without stenosis. Superior cerebellar arteries patent bilaterally. Both PCAs primarily supplied via the basilar well perfused or distal aspects. Small right posterior communicating artery noted. Venous sinuses: Normal enhancement seen throughout the superior sagittal sinus to the torcula. Torcula itself appears patent. Transverse and sigmoid sinuses are patent as are the visualized proximal internal jugular veins. Straight sinus, vein of Galen, internal cerebral veins,  and basal veins of Rosenthal appear patent. No evidence for dural sinus thrombosis. Cavernous sinus appears patent. Superior orbital veins symmetric and within normal limits. Anatomic variants: None significant. No aneurysm or other vascular abnormality. Review of the MIP images confirms the above findings IMPRESSION: 1. Negative CTA for large vessel occlusion. No high-grade or correctable stenosis within the major arterial vasculature of the head and neck. No aneurysm. 2. 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 3.  Negative CT venogram.  No evidence for dural sinus thrombosis. 4. Emphysema (ICD10-J43.9). Superimposed patchy opacities within the posterior left greater than right lungs could reflect atelectasis or infiltrates. Correlation with physical exam and any potential symptomatology recommended. Electronically Signed   By: Jeannine Boga M.D.   On: 05/28/2020 04:07   MR BRAIN W WO CONTRAST  Result Date: 05/31/2020 CLINICAL DATA:  Sarcoidosis; encephalitis. EXAM: MRI HEAD WITHOUT AND WITH CONTRAST TECHNIQUE: Multiplanar, multiecho pulse sequences of the brain and surrounding structures were obtained without and with intravenous contrast. CONTRAST:  4mL GADAVIST GADOBUTROL 1 MMOL/ML IV SOLN COMPARISON:  Head CT May 27, 2020 FINDINGS: Brain: No acute infarction, hemorrhage, hydrocephalus, extra-axial collection or mass lesion. Rare foci of T2 hyperintensity are seen within white matter of the cerebral hemispheres, nonspecific. No focus of abnormal contrast enhancement Vascular: Normal flow voids. Skull and upper cervical spine: Normal marrow signal. Sinuses/Orbits: Small mucous retention cyst in the bilateral maxillary sinuses. The orbits maintained. Other: Mild mucosal thickening of the right mastoid cells. IMPRESSION: Minimal amount of nonspecific T2 hyperintense lesions the white matter, may represent early microvascular ischemic  changes. No focus of abnormal contrast enhancement. Electronically Signed   By: Pedro Earls M.D.   On: 05/31/2020 10:39   DG Chest Port 1 View  Result Date: 05/27/2020 CLINICAL DATA:  Questionable sepsis - evaluate for abnormality EXAM: PORTABLE CHEST 1 VIEW COMPARISON:  Radiograph 01/28/2006 FINDINGS: The cardiomediastinal contours are normal. Chronic blunting of left costophrenic angle. Mild interstitial coarsening. Pulmonary vasculature is normal. No consolidation, pleural effusion, or pneumothorax. No acute osseous abnormalities are seen. IMPRESSION: 1. Mild interstitial coarsening of unknown acuity, likely chronic. 2. Chronic blunting of the left costophrenic angle. Electronically Signed   By: Keith Rake M.D.   On: 05/27/2020 20:41   DG Carlena Hurl SPEECH PATH  Result Date: 05/18/2020 [place] [address] [place], [place], [address] Phone: [phone]   Fax:  [phone] Modified Barium Swallow Patient Details Name: Angela Mullins MRN: [id number] Date of Birth: [birth year] No data recorded Encounter Date: 05/18/2020  End of Session - 05/18/20 1254   Visit Number 1   Number of Visits 1   Authorization Type [employer]   SLP Start Time [id number]   SLP Stop Time  1215   SLP Time Calculation (min) 30 min   Activity Tolerance Patient tolerated treatment well     Past Medical History: Diagnosis Date . Anxiety  . GERD (gastroesophageal reflux disease)  . Hypercholesteremia  . Hypertension  . Vitamin D deficiency  Past Surgical History: Procedure Laterality Date . CHOLECYSTECTOMY   . PELVIC FRACTURE SURGERY   . WISDOM TOOTH EXTRACTION   There were no vitals filed for this visit.  Subjective Assessment - 05/18/20 1247   Subjective "I have to throw my head back to swallow pills."   Special Tests MBSS   Currently in Pain? No/denies      General - 05/18/20 1248    General Information  Date of Onset 05/10/20   HPI Jazabella Brush is a 65 yo female who was referred  by Dr. Maylon Peppers for MBSS due to Pt with reports of dysphagia. Pt with PMH significant for anxiety, HTN, HLD, and GERD. Patient reports that since she was a teenager she has felt recurrent episodes of dysphagia when swallowing pills, very occasionally when swallowing solid food.  She reported that she tries to chew very well reported to avoid having any choking episodes.  However,  she reported that she feels "strangled" when food goes occasionally as if there was some sort of pressure in her throat.  Does not have any symptoms when swallowing liquids.  More recently, for the last year she has presented episodes of feeling a foreign body in her throat as "if something was lodged in her throat".  She constantly clears her throat but reports no phlegm comes out. She quit smoking last year. Her Prilosec was increased to 40 mg twice per day last week. She does not sleep with the HOB elevated, but uses a wedge. No recent reports of PNA.   Type of Study MBS-Modified Barium Swallow Study   Previous Swallow Assessment None on record   Diet Prior to this Study Regular;Thin liquids   Temperature Spikes Noted No   Respiratory Status Room air   History of Recent Intubation No   Behavior/Cognition Alert;Cooperative;Pleasant mood   Oral Cavity Assessment Within Functional Limits   Oral Care Completed by SLP No   Oral Cavity - Dentition Adequate natural dentition   Vision Functional for self feeding   Self-Feeding Abilities Able to feed self   Patient Positioning Upright in chair   Baseline Vocal Quality Normal   Volitional Cough Strong   Volitional Swallow Able to elicit   Anatomy Within functional limits   Pharyngeal Secretions Not observed secondary MBS      Oral Preparation/Oral Phase - 05/18/20 1248    Oral Preparation/Oral Phase  Oral Phase Impaired    Oral - Thin  Oral - Thin Teaspoon Within functional limits   Oral - Thin Cup Within functional limits    Oral - Solids  Oral - Puree Within functional limits   Oral -  Regular Within functional limits   Oral - Pill Within functional limits    Electrical stimulation - Oral Phase  Was Electrical Stimulation Used No      Pharyngeal Phase - 05/18/20 1250    Pharyngeal Phase  Pharyngeal Phase Within functional limits      Cricopharyngeal Phase - 05/18/20 1250    Cervical Esophageal Phase  Cervical Esophageal Phase Impaired    Cervical Esophageal Phase - Comment  Cervical Esophageal Comment Pt points to ~C7 as source of globus with solids and pill   Other Esophageal Phase Observations Pt with delayed esophageal transit, incomplete clearance with primary wave, stasis of pill near LES which cleared with puree wash (did not clear with thin wash)      Plan - 05/18/20 1254   Clinical Impression Statement MBSS completed. Pt assessed with barium tinged thin, puree, regular textures, and barium tablet. Oropharyngeal swallow is WNL with the exception of Pt producing effortful swallows with solids, however suspect this is due to esophageal component. Swallow initiation was timely and hyolaryngeal excursion WNL, no penetration/aspiration or significant pharyngeal residuals post swallow. Esophageal sweep completed before and after presentation of barium table revealed incomplete clearance of barium via primary wave. The barium tablet became transiently delayed near the LES and was not cleared by liquid wash, but was cleared with a bite of puree. Pt reports that she typically takes larger pills balled up into small pieces of bread to facilitate swallow. Pt's reports of globus, effortful swallows, non productive coughing, and regurgitation appear more consistent with esophageal dysphagia. She was recently increased to prilosec 2x per day. Pt was encouraged to alternate solids and liquids, adhere to reflux precautions, and elevate the head of her bed. No further SLP services indicated at this time. Pt has EGD  scheduled for Tuesday with Dr. Jenetta Downer.   Consulted and Agree with Plan of Care Patient      Patient will benefit from skilled therapeutic intervention in order to improve the following deficits and impairments:  Dysphagia, oropharyngeal phase  Recommendations/Treatment - 05/18/20 1252    Swallow Evaluation Recommendations  Recommended Consults Consider esophageal assessment   SLP Diet Recommendations Age appropriate regular;Thin   Liquid Administration via Cup   Medication Administration Whole meds with liquid   Supervision Patient able to self feed   Compensations Follow solids with liquid   Postural Changes Seated upright at 90 degrees;Remain upright for at least 30 minutes after feeds/meals      Prognosis - 05/18/20 1253    Prognosis  Prognosis for Safe Diet Advancement Good   Barriers/Prognosis Comment suspected primary  esophageal dysphagia    Individuals Consulted  Consulted and Agree with Results and Recommendations Patient   Report Sent to  Referring physician     Problem List Patient Active Problem List  Diagnosis Date Noted . Dysphagia 05/10/2020 . Throat discomfort 05/10/2020 . GERD (gastroesophageal reflux disease) 05/10/2020 Thank you, Genene Churn, [place] 05/18/2020, 1:01 PM [facility] [address] [place], [place], [address] Phone: [phone]   Fax:  [phone] Name: Angela Mullins MRN: [id number] Date of Birth: 1955/10/05  CT VENOGRAM HEAD  Result Date: 05/28/2020 CLINICAL DATA:  Initial evaluation for acute headache. EXAM: CT ANGIOGRAPHY HEAD AND NECK CT VENOGRAM OF THE HEAD WITH CONTRAST. TECHNIQUE: Multidetector CT imaging of the head and neck was performed using the standard protocol during bolus administration of intravenous contrast. Multiplanar CT image reconstructions and MIPs were obtained to evaluate the vascular anatomy. Carotid stenosis measurements (when applicable) are obtained utilizing NASCET criteria, using the distal internal carotid diameter as the denominator. CONTRAST:  72mL OMNIPAQUE IOHEXOL 350  MG/ML SOLN COMPARISON:  Prior noncontrast CT from 05/27/2020. FINDINGS: CTA NECK FINDINGS Aortic arch: Visualized aortic arch normal caliber with normal branch pattern. Mild atheromatous change about the arch and origin of the great vessels without hemodynamically significant stenosis. Right carotid system: Right common and internal carotid arteries widely patent without stenosis, dissection or occlusion. Minimal atheromatous plaque about the right bifurcation without stenosis. Left carotid system: Left CCA patent from its origin to the bifurcation without stenosis. 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 Left ICA patent distally without stenosis, dissection or occlusion. Vertebral arteries: Both vertebral arteries arise from the subclavian arteries. No proximal subclavian artery stenosis. Vertebral arteries patent within the neck without stenosis, dissection or occlusion. Skeleton: No visible acute osseous finding. No discrete or worrisome osseous lesions. Other neck: No other acute soft tissue abnormality within the neck. No mass or adenopathy. Upper chest: Advanced emphysema. Superimposed scattered patchy opacities within the posterior left greater than right lungs, could reflect atelectasis or infiltrates. Review of the MIP images confirms the above findings CTA HEAD FINDINGS Anterior circulation: Petrous segments widely patent bilaterally. Minimal atheromatous change within the carotid siphons without significant stenosis or other abnormality. A1 segments widely patent. Normal anterior communicating artery complex. Anterior cerebral arteries patent to their distal aspects without stenosis. No M1 stenosis or occlusion. Right M1 bifurcates early. Bifurcations M cells within normal limits. Distal MCA branches well perfused and symmetric. Posterior circulation: Both V4 segments patent to the vertebrobasilar junction without stenosis. Both PICA  origins patent and normal. Basilar patent to its distal aspect without stenosis. Superior  cerebellar arteries patent bilaterally. Both PCAs primarily supplied via the basilar well perfused or distal aspects. Small right posterior communicating artery noted. Venous sinuses: Normal enhancement seen throughout the superior sagittal sinus to the torcula. Torcula itself appears patent. Transverse and sigmoid sinuses are patent as are the visualized proximal internal jugular veins. Straight sinus, vein of Galen, internal cerebral veins, and basal veins of Rosenthal appear patent. No evidence for dural sinus thrombosis. Cavernous sinus appears patent. Superior orbital veins symmetric and within normal limits. Anatomic variants: None significant. No aneurysm or other vascular abnormality. Review of the MIP images confirms the above findings IMPRESSION: 1. Negative CTA for large vessel occlusion. No high-grade or correctable stenosis within the major arterial vasculature of the head and neck. No aneurysm. 2. 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 3. Negative CT venogram.  No evidence for dural sinus thrombosis. 4. Emphysema (ICD10-J43.9). Superimposed patchy opacities within the posterior left greater than right lungs could reflect atelectasis or infiltrates. Correlation with physical exam and any potential symptomatology recommended. Electronically Signed   By: Jeannine Boga M.D.   On: 05/28/2020 04:07   DG FLUORO GUIDE LUMBAR PUNCTURE  Result Date: 05/29/2020 CLINICAL DATA:  Headache EXAM: DIAGNOSTIC LUMBAR PUNCTURE UNDER FLUOROSCOPIC GUIDANCE COMPARISON:  Head CT 05/27/2020 FLUOROSCOPY TIME:  Fluoroscopy Time:  20 seconds Radiation Exposure Index (if provided by the fluoroscopic device): 1.1 mGy Number of Acquired Spot Images: 0 PROCEDURE: Informed consent was obtained from the patient prior to the procedure, including potential complications  of headache, allergy, and pain. With the patient prone, the lower back was prepped with Betadine. 1% Lidocaine was used for local anesthesia. Lumbar puncture was performed at the L2-3 level using a 20 gauge gauge needle with return of clear CSF with an opening pressure of 35 cm water. Twelve ml of CSF were obtained for laboratory studies. The patient tolerated the procedure well and there were no apparent complications. IMPRESSION: Technically successful lumbar puncture under fluoroscopic guidance as above. Electronically Signed   By: Rolm Baptise M.D.   On: 05/29/2020 16:14     Microbiology: Recent Results (from the past 240 hour(s))  Blood Culture (routine x 2)     Status: None   Collection Time: 05/27/20  7:55 PM   Specimen: BLOOD  Result Value Ref Range Status   Specimen Description BLOOD BLOOD RIGHT FOREARM  Final   Special Requests   Final    Blood Culture adequate volume BOTTLES DRAWN AEROBIC AND ANAEROBIC   Culture   Final    NO GROWTH 5 DAYS Performed at [facility], [address] [place], [place] [address]    Report Status 06/01/2020 FINAL  Final  Blood Culture (routine x 2)     Status: None   Collection Time: 05/27/20  8:00 PM   Specimen: BLOOD  Result Value Ref Range Status   Specimen Description BLOOD BLOOD LEFT ARM  Final   Special Requests   Final    Blood Culture adequate volume BOTTLES DRAWN AEROBIC AND ANAEROBIC   Culture   Final    NO GROWTH 5 DAYS Performed at [facility], [address], [place], [place] [address]    Report Status 06/01/2020 FINAL  Final  Resp Panel by RT-PCR (Flu A&B, Covid) Nasopharyngeal Swab     Status: None   Collection Time: 05/27/20  8:26 PM   Specimen: Nasopharyngeal Swab; Nasopharyngeal(NP) swabs in vial transport medium  Result Value Ref  Range Status   SARS Coronavirus 2 by RT PCR NEGATIVE NEGATIVE Final    Comment: (NOTE) SARS-CoV-2 target nucleic acids are NOT DETECTED.  The SARS-CoV-2 RNA is generally detectable in upper  respiratory specimens during the acute phase of infection. The lowest concentration of SARS-CoV-2 viral copies this assay can detect is 138 copies/mL. A negative result does not preclude SARS-Cov-2 infection and should not be used as the sole basis for treatment or other patient management decisions. A negative result may occur with  improper specimen collection/handling, submission of specimen other than nasopharyngeal swab, presence of viral mutation(s) within the areas targeted by this assay, and inadequate number of viral copies(<138 copies/mL). A negative result must be combined with clinical observations, patient history, and epidemiological information. The expected result is Negative.  Fact Sheet for Patients:  EntrepreneurPulse.com.au  Fact Sheet for Healthcare Providers:  IncredibleEmployment.be  This test is no t yet approved or cleared by the [place] FDA and  has been authorized for detection and/or diagnosis of SARS-CoV-2 by FDA under an Emergency Use Authorization (EUA). This EUA will remain  in effect (meaning this test can be used) for the duration of the COVID-19 declaration under Section 564(b)(1) of the Act, 21 U.S.C.section 360bbb-3(b)(1), unless the authorization is terminated  or revoked sooner.       Influenza A by PCR NEGATIVE NEGATIVE Final   Influenza B by PCR NEGATIVE NEGATIVE Final    Comment: (NOTE) The Xpert Xpress SARS-CoV-2/FLU/RSV plus assay is intended as an aid in the diagnosis of influenza from Nasopharyngeal swab specimens and should not be used as a sole basis for treatment. Nasal washings and aspirates are unacceptable for Xpert Xpress SARS-CoV-2/FLU/RSV testing.  Fact Sheet for Patients: EntrepreneurPulse.com.au  Fact Sheet for Healthcare Providers: IncredibleEmployment.be  This test is not yet approved or cleared by the [place] FDA and has been  authorized for detection and/or diagnosis of SARS-CoV-2 by FDA under an Emergency Use Authorization (EUA). This EUA will remain in effect (meaning this test can be used) for the duration of the COVID-19 declaration under Section 564(b)(1) of the Act, 21 U.S.C. section 360bbb-3(b)(1), unless the authorization is terminated or revoked.  Performed at [facility], [address] [place], [place] [address]   MRSA PCR Screening     Status: None   Collection Time: 05/28/20  9:01 AM   Specimen: Nasopharyngeal  Result Value Ref Range Status   MRSA by PCR NEGATIVE NEGATIVE Final    Comment:        The GeneXpert MRSA Assay (FDA approved for NASAL specimens only), is one component of a comprehensive MRSA colonization surveillance program. It is not intended to diagnose MRSA infection nor to guide or monitor treatment for MRSA infections. Performed at [facility], [address], [place], [place] [address]   Urine culture     Status: None   Collection Time: 05/28/20 12:09 PM   Specimen: Urine, Catheterized  Result Value Ref Range Status   Specimen Description   Final    URINE, CATHETERIZED Performed at [facility], [address] [place], [place] [address]    Special Requests   Final    NONE Performed at [facility], [address], [place], [place] [address]    Culture   Final    NO GROWTH Performed at [facility], [place] [address] [place],  [address]    Report Status 05/30/2020 FINAL  Final  CSF culture w Stat Gram Stain  Status: None   Collection Time: 05/29/20  3:30 PM   Specimen: CSF; Cerebrospinal Fluid  Result Value Ref Range Status   Specimen Description   Final    CSF Performed at [facility], [address] [place], [place] [address]    Special Requests   Final    NONE Performed at [facility], [address] [place], [place] [address]    Gram Stain   Final    NO ORGANISMS SEEN WBC PRESENT,BOTH PMN AND MONONUCLEAR Gram Stain Report Called  to,Read Back By and Verified With: COLEMAN L. AT [id number] ON [id number] BY THOMPSON S. Performed at [facility], [address], [place], [place] [address]    Culture   Final    NO GROWTH 3 DAYS Performed at [facility] [address], [place], [place] [address]    Report Status 06/02/2020 FINAL  Final     Labs: Basic Metabolic Panel: Recent Labs  Lab 05/30/20 0524 05/31/20 0554 06/01/20 0630 06/02/20 0418 06/03/20 0520  NA 141 142 139 138 139  K 3.3* 3.8 3.1* 4.4 4.3  CL 107 107 104 105 104  CO2 26 26 26 25 27   GLUCOSE 94 147* 84 110* 126*  BUN 19 13 19 16 14   CREATININE 0.58 0.53 0.68 0.58 0.57  CALCIUM 8.6* 9.0 8.8* 8.8* 8.9  MG  --   --   --  1.4* 1.7   Liver Function Tests: Recent Labs  Lab 05/28/20 0438 05/30/20 0524 06/01/20 0630 06/02/20 0418 06/03/20 0520  AST 45* 26 35 33 20  ALT 66* 49* 48* 54* 47*  ALKPHOS 151* 121 99 104 101  BILITOT 0.7 0.5 0.5 0.4 0.4  PROT 5.8* 5.4* 5.3* 5.4* 5.5*  ALBUMIN 2.8* 2.4* 2.5* 2.7* 2.7*   Recent Labs  Lab 05/27/20 1921  LIPASE 19   No results for input(s): AMMONIA in the last 168 hours. CBC: Recent Labs  Lab 05/27/20 1921 05/27/20 1933 05/28/20 0438 05/30/20 0524 06/01/20 0630 06/02/20 0418  WBC 19.3*  --  14.8* 6.3 10.4 5.9  NEUTROABS 17.4*  --  12.6*  --  7.0 4.8  HGB 15.1* 15.6* 11.9* 12.0 11.7* 12.1  HCT 45.7 46.0 37.2 35.8* 35.9* 36.5  MCV 89.6  --  92.1 88.8 90.2 89.2  PLT 273  --  228 224 339 335   Cardiac Enzymes: No results for input(s): CKTOTAL, CKMB, CKMBINDEX, TROPONINI in the last 168 hours. BNP: Invalid input(s): POCBNP CBG: Recent Labs  Lab 05/27/20 1934  GLUCAP 136*    Time coordinating discharge:  36 minutes  Signed:  Orson Eva, DO [facility] Pager: [phone] 06/03/2020, 7:37 AM

## 2020-06-08 ENCOUNTER — Other Ambulatory Visit (HOSPITAL_COMMUNITY): Payer: Self-pay | Admitting: Family Medicine

## 2020-06-08 DIAGNOSIS — R748 Abnormal levels of other serum enzymes: Secondary | ICD-10-CM

## 2020-06-29 ENCOUNTER — Emergency Department (HOSPITAL_COMMUNITY)

## 2020-06-29 ENCOUNTER — Emergency Department (HOSPITAL_COMMUNITY)
Admission: EM | Admit: 2020-06-29 | Discharge: 2020-06-29 | Disposition: A | Discharge status: Home / Self Care | Attending: Emergency Medicine | Admitting: Emergency Medicine

## 2020-06-29 ENCOUNTER — Other Ambulatory Visit: Payer: Self-pay

## 2020-06-29 ENCOUNTER — Encounter (HOSPITAL_COMMUNITY): Payer: Self-pay | Admitting: *Deleted

## 2020-06-29 DIAGNOSIS — Z79899 Other long term (current) drug therapy: Secondary | ICD-10-CM | POA: Insufficient documentation

## 2020-06-29 DIAGNOSIS — Z87891 Personal history of nicotine dependence: Secondary | ICD-10-CM | POA: Insufficient documentation

## 2020-06-29 DIAGNOSIS — R221 Localized swelling, mass and lump, neck: Secondary | ICD-10-CM | POA: Diagnosis not present

## 2020-06-29 DIAGNOSIS — I2510 Atherosclerotic heart disease of native coronary artery without angina pectoris: Secondary | ICD-10-CM | POA: Insufficient documentation

## 2020-06-29 DIAGNOSIS — I10 Essential (primary) hypertension: Secondary | ICD-10-CM | POA: Diagnosis not present

## 2020-06-29 DIAGNOSIS — R519 Headache, unspecified: Secondary | ICD-10-CM | POA: Diagnosis not present

## 2020-06-29 DIAGNOSIS — R7401 Elevation of levels of liver transaminase levels: Secondary | ICD-10-CM | POA: Insufficient documentation

## 2020-06-29 DIAGNOSIS — J441 Chronic obstructive pulmonary disease with (acute) exacerbation: Secondary | ICD-10-CM | POA: Diagnosis not present

## 2020-06-29 DIAGNOSIS — R7989 Other specified abnormal findings of blood chemistry: Secondary | ICD-10-CM

## 2020-06-29 DIAGNOSIS — Z7982 Long term (current) use of aspirin: Secondary | ICD-10-CM | POA: Diagnosis not present

## 2020-06-29 DIAGNOSIS — I6522 Occlusion and stenosis of left carotid artery: Secondary | ICD-10-CM

## 2020-06-29 HISTORY — DX: Meningitis, unspecified: G03.9

## 2020-06-29 LAB — CBC WITH DIFFERENTIAL/PLATELET
Abs Immature Granulocytes: 0.01 10*3/uL (ref 0.00–0.07)
Basophils Absolute: 0 10*3/uL (ref 0.0–0.1)
Basophils Relative: 1 %
Eosinophils Absolute: 0.2 10*3/uL (ref 0.0–0.5)
Eosinophils Relative: 4 %
HCT: 44.4 % (ref 36.0–46.0)
Hemoglobin: 14.1 g/dL (ref 12.0–15.0)
Immature Granulocytes: 0 %
Lymphocytes Relative: 37 %
Lymphs Abs: 1.6 10*3/uL (ref 0.7–4.0)
MCH: 29.7 pg (ref 26.0–34.0)
MCHC: 31.8 g/dL (ref 30.0–36.0)
MCV: 93.5 fL (ref 80.0–100.0)
Monocytes Absolute: 0.4 10*3/uL (ref 0.1–1.0)
Monocytes Relative: 9 %
Neutro Abs: 2.1 10*3/uL (ref 1.7–7.7)
Neutrophils Relative %: 49 %
Platelets: 259 10*3/uL (ref 150–400)
RBC: 4.75 MIL/uL (ref 3.87–5.11)
RDW: 13.8 % (ref 11.5–15.5)
WBC: 4.3 10*3/uL (ref 4.0–10.5)
nRBC: 0 % (ref 0.0–0.2)

## 2020-06-29 LAB — COMPREHENSIVE METABOLIC PANEL
ALT: 61 U/L — ABNORMAL HIGH (ref 0–44)
AST: 49 U/L — ABNORMAL HIGH (ref 15–41)
Albumin: 4.5 g/dL (ref 3.5–5.0)
Alkaline Phosphatase: 95 U/L (ref 38–126)
Anion gap: 10 (ref 5–15)
BUN: 10 mg/dL (ref 8–23)
CO2: 28 mmol/L (ref 22–32)
Calcium: 9.7 mg/dL (ref 8.9–10.3)
Chloride: 101 mmol/L (ref 98–111)
Creatinine, Ser: 0.8 mg/dL (ref 0.44–1.00)
GFR, Estimated: >60 mL/min (ref >60)
Glucose, Bld: 98 mg/dL (ref 70–99)
Potassium: 4 mmol/L (ref 3.5–5.1)
Sodium: 139 mmol/L (ref 135–145)
Total Bilirubin: 0.5 mg/dL (ref 0.3–1.2)
Total Protein: 7.4 g/dL (ref 6.5–8.1)

## 2020-06-29 MED ORDER — IOHEXOL 350 MG/ML SOLN
100.0000 mL | Freq: Once | INTRAVENOUS | Status: AC | PRN
  Administered 2020-06-29: 100 mL via INTRAVENOUS

## 2020-06-29 NOTE — Discharge Instructions (Addendum)
You are seen in the emergency department for continued headaches after meningitis in May.  You also felt some swelling in your lower lateral neck.  Blood work showed some mild elevation of your liver function test that your doctor is aware of.  You had a CAT scan of your head and neck that did not show any explanation for your symptoms.  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  Please contact your primary care doctor for close follow-up.  Also consider scheduling an appointment with neurology.  Return to the emergency department if any worsening or concerning symptoms.

## 2020-06-29 NOTE — ED Triage Notes (Signed)
Pt with swelling to left neck x 3 days.  Denies any trouble with breathing or swallowing. C/o HA everyday exception for 1-2 days, recent dx of meningitis back in May.

## 2020-06-29 NOTE — ED Provider Notes (Signed)
[facility] EMERGENCY DEPARTMENT Provider Note   CSN: [id number] Arrival date & time: 06/29/20  1227     History Chief Complaint  Patient presents with   neck swelling    Angela Mullins is a 65 y.o. female.  She was admitted and May for headache and found to have likely aseptic meningitis.  They could not completely rule out that there was a bacterial source because she received antibiotics for 2 days prior to LP.  Culture was ultimately negative.  She has had daily headaches since discharge but they seem to have gotten worse over the last week.  She is also noticed a little fullness of her lower neck and is concerned she has some lymphadenopathy.  She had axillary lymph nodes earlier that have resolved.  Not currently on any antibiotics.  She saw her PCP today who referred her here for some imaging.  Sounds like she is also had some elevated liver function tests recently and is awaiting an ultrasound for that.  No fevers or chills cough sore throat nausea vomiting diarrhea.  She said her usual headaches are 5 out of 10 and controlled with Tylenol and ibuprofen but lately they have been more like 8 out of 10.  The history is provided by the patient.  Headache Pain location:  Frontal Quality:  Dull Severity currently:  2/10 Severity at highest:  8/10 Onset quality:  Gradual Duration:  4 weeks Timing:  Intermittent Progression:  Unchanged Chronicity:  Recurrent Relieved by:  Nothing Worsened by:  Nothing Ineffective treatments:  Aspirin, NSAIDs and prescription medications Associated symptoms: neck pain and swollen glands   Associated symptoms: no abdominal pain, no cough, no diarrhea, no eye pain, no facial pain, no fever, no focal weakness, no near-syncope, no neck stiffness, no numbness, no sore throat, no syncope, no tingling and no visual change       Past Medical History:  Diagnosis Date   Anxiety    Aortic atherosclerosis ([place]) 05/28/2020   GERD (gastroesophageal reflux  disease)    Hypercholesteremia    Hypertension    Meningitis    Vitamin D deficiency     Patient Active Problem List   Diagnosis Date Noted   Aseptic meningitis 05/31/2020   COPD with acute exacerbation ([place]) 05/31/2020   Acute respiratory failure with hypoxia ([place]) 05/31/2020   Acute headache 05/28/2020   SIRS (systemic inflammatory response syndrome) ([place]) 05/28/2020   Vitamin D deficiency 05/28/2020   Anxiety 05/28/2020   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   Aortic atherosclerosis ([place]) 05/28/2020   Carotid artery plaque, left 05/28/2020   Sinus bradycardia 05/28/2020   Hypercholesteremia    Hypertension    Hyperglycemia    Dysphagia 05/10/2020   Throat discomfort 05/10/2020   GERD (gastroesophageal reflux disease) 05/10/2020    Past Surgical History:  Procedure Laterality Date   BIOPSY  05/23/2020   Procedure: BIOPSY;  Surgeon: Harvel Quale, MD;  Location: [facility] END[facility];  Service: Gastroenterology;;  gastric esophageal   CHOLECYSTECTOMY     COLONOSCOPY WITH PROPOFOL N/A 05/23/2020   Procedure: COLONOSCOPY WITH PROPOFOL;  Surgeon: Harvel Quale, MD;  Location: [facility];  Service: Gastroenterology;  Laterality: N/A;  10:45 AM   ESOPHAGEAL DILATION N/A 05/23/2020   Procedure: ESOPHAGEAL DILATION;  Surgeon: Harvel Quale, MD;  Location: [facility];  Service: Gastroenterology;  Laterality: N/A;   ESOPHAGOGASTRODUODENOSCOPY (EGD) WITH PROPOFOL N/A 05/23/2020   Procedure: ESOPHAGOGASTRODUODENOSCOPY (EGD) WITH PROPOFOL;  Surgeon: Harvel Quale, MD;  Location: [facility]  ENDO SUITE;  Service: Gastroenterology;  Laterality: N/A;   PELVIC FRACTURE SURGERY     POLYPECTOMY  05/23/2020   Procedure: POLYPECTOMY;  Surgeon: Harvel Quale, MD;  Location: [facility];  Service: Gastroenterology;;  colon   WISDOM TOOTH EXTRACTION       OB History   No obstetric history on file.     Family History  Problem Relation Age of Onset    Alzheimer's disease Mother    Heart disease Father    Diabetes Sister    Heart disease Sister    Hypertension Brother     Social History   Tobacco Use   Smoking status: Former    Pack years: 0.00   Smokeless tobacco: Never   Tobacco comments:    Quit 2021  Vaping Use   Vaping Use: Never used  Substance Use Topics   Alcohol use: Yes    Comment: occasionally   Drug use: Never    Home Medications Prior to Admission medications   Medication Sig Start Date End Date Taking? Authorizing Provider  aspirin EC 81 MG tablet Take 81 mg by mouth daily. Swallow whole.    [provider]  atorvastatin (LIPITOR) 80 MG tablet Take 80 mg by mouth daily.    [provider]  cetirizine (ZYRTEC) 10 MG tablet Take 10 mg by mouth daily.    [provider]  clonazePAM (KLONOPIN) 0.5 MG tablet Take 0.5 mg by mouth 2 (two) times daily.    [provider]  dexamethasone (DECADRON) 2 MG tablet Take 3 tablets (6 mg total) by mouth 2 (two) times daily. X 3 days, then 2 tabs twice daily x 3 days, then 1 tab twice a day x 3 days, then 1 tab daily x 3 days 06/03/20   Tat, Shanon Brow, MD  diphenhydrAMINE (BENADRYL) 25 mg capsule Take 25 mg by mouth every 8 (eight) hours as needed for itching (Swelling).    [provider]  doxycycline (VIBRA-TABS) 100 MG tablet Take 1 tablet (100 mg total) by mouth every 12 (twelve) hours. 06/03/20   Orson Eva, MD  eletriptan (RELPAX) 40 MG tablet Take 40 mg by mouth every 2 (two) hours as needed for migraine. 05/26/20   [provider]  ergocalciferol (VITAMIN D2) 1.25 MG (50000 UT) capsule Take 50,000 Units by mouth once a week.    [provider]  fenofibrate micronized (LOFIBRA) 200 MG capsule Take 200 mg by mouth daily before breakfast.    [provider]  HYDROcodone-acetaminophen (NORCO/VICODIN) 5-325 MG tablet Take 1 tablet by mouth every 6 (six) hours as needed for moderate pain. 06/03/20   Orson Eva, MD   ibuprofen (ADVIL) 200 MG tablet Take 400 mg by mouth daily as needed for headache.    [provider]  lisinopril (ZESTRIL) 10 MG tablet Take 10 mg by mouth daily.    [provider]  magnesium oxide (MAG-OX) 400 (240 Mg) MG tablet Take 1 tablet (400 mg total) by mouth daily. 06/03/20   Orson Eva, MD  Multiple Vitamins-Minerals (MULTIVITAMIN WITH MINERALS) tablet Take 1 tablet by mouth daily. 50 +    [provider]  omeprazole (PRILOSEC) 40 MG capsule Take 40 mg by mouth 2 (two) times daily.    [provider]    Allergies    Demerol [meperidine hcl] and Quinine derivatives  Review of Systems   Review of Systems  Constitutional:  Negative for fever.  HENT:  Negative for sore throat.  Eyes:  Negative for pain.  Respiratory:  Negative for cough and shortness of breath.   Cardiovascular:  Negative for chest pain, syncope and near-syncope.  Gastrointestinal:  Negative for abdominal pain and diarrhea.  Genitourinary:  Negative for dysuria.  Musculoskeletal:  Positive for neck pain. Negative for neck stiffness.  Skin:  Negative for rash.  Neurological:  Positive for headaches. Negative for focal weakness and numbness.   Physical Exam Updated Vital Signs BP (!) 112/57   Pulse 85   Temp (!) 97.3 F (36.3 C) (Temporal)   Resp 12   Ht 5' 3.75" (1.619 m)   Wt 54.9 kg   SpO2 97%   BMI 20.93 kg/m   Physical Exam Vitals and nursing note reviewed.  Constitutional:      General: She is not in acute distress.    Appearance: Normal appearance. She is well-developed.  HENT:     Head: Normocephalic and atraumatic.  Eyes:     Conjunctiva/sclera: Conjunctivae normal.  Neck:     Vascular: No carotid bruit.     Trachea: Trachea normal.     Meningeal: Brudzinski's sign and Kernig's sign absent.  Cardiovascular:     Rate and Rhythm: Normal rate and regular rhythm.     Heart sounds: No murmur heard. Pulmonary:     Effort: Pulmonary effort is normal.  No respiratory distress.     Breath sounds: Normal breath sounds.  Abdominal:     Palpations: Abdomen is soft.     Tenderness: There is no abdominal tenderness.  Musculoskeletal:        General: No deformity or signs of injury. Normal range of motion.     Cervical back: Normal range of motion and neck supple. No rigidity.  Lymphadenopathy:     Cervical: No cervical adenopathy.  Skin:    General: Skin is warm and dry.  Neurological:     General: No focal deficit present.     Mental Status: She is alert and oriented to person, place, and time.     Cranial Nerves: No cranial nerve deficit.     Sensory: No sensory deficit.     Motor: No weakness.     Gait: Gait normal.    ED Results / Procedures / Treatments   Labs (all labs ordered are listed, but only abnormal results are displayed) Labs Reviewed  COMPREHENSIVE METABOLIC PANEL - Abnormal; Notable for the following components:      Result Value   AST 49 (*)    ALT 61 (*)    All other components within normal limits  CBC WITH DIFFERENTIAL/PLATELET    EKG None  Radiology CT Angio Head W/Cm &/Or Wo Cm  Result Date: 06/29/2020 CLINICAL DATA:  Acute neuro deficit.  Rule out stroke. EXAM: CT ANGIOGRAPHY HEAD AND NECK TECHNIQUE: Multidetector CT imaging of the head and neck was performed using the standard protocol during bolus administration of intravenous contrast. Multiplanar CT image reconstructions and MIPs were obtained to evaluate the vascular anatomy. Carotid stenosis measurements (when applicable) are obtained utilizing NASCET criteria, using the distal internal carotid diameter as the denominator. CONTRAST:  198mL OMNIPAQUE IOHEXOL 350 MG/ML SOLN COMPARISON:  CT head 05/27/2020.  CT angio head neck 05/28/2020 FINDINGS: CT HEAD FINDINGS Brain: No evidence of acute infarction, hemorrhage, hydrocephalus, extra-axial collection or mass lesion/mass effect. Vascular: Negative for hyperdense vessel Skull: Negative Sinuses: Negative  Orbits: Negative Review of the MIP images confirms the above findings CTA NECK FINDINGS Aortic arch: Standard branching. Imaged portion shows  no evidence of aneurysm or dissection. No significant stenosis of the major arch vessel origins. Mild atherosclerotic calcification aortic arch. Right carotid system: Right carotid widely patent without stenosis or dissection Left carotid system: Atherosclerotic calcification left carotid bifurcation. Approximately 40% diameter stenosis proximal left internal carotid artery, unchanged from the prior study. Vertebral arteries: Both vertebral arteries patent to the skull base without stenosis. Skeleton: Negative Other neck: Negative for mass or adenopathy in the neck. No soft tissue swelling. Upper chest: Mild apical emphysema bilaterally. No acute abnormality. Review of the MIP images confirms the above findings CTA HEAD FINDINGS Anterior circulation: Internal carotid artery widely patent through the skull base and cavernous segment with minimal atherosclerotic disease. Anterior and middle cerebral arteries widely patent bilaterally without significant stenosis or large vessel occlusion. Posterior circulation: Both vertebral arteries patent to the basilar. PICA patent bilaterally. Basilar widely patent. Superior cerebellar and posterior cerebral arteries patent bilaterally without stenosis. Venous sinuses: Normal venous enhancement. Anatomic variants: None Review of the MIP images confirms the above findings IMPRESSION: 1. CT head negative for acute abnormality 2. Negative for intracranial large vessel occlusion or significant stenosis 3. 40% diameter stenosis proximal left internal carotid artery due to atherosclerotic disease. Right carotid and both vertebral arteries widely patent. Electronically Signed   By: Franchot Gallo M.D.   On: 06/29/2020 18:02   DG Chest 2 View  Result Date: 06/29/2020 CLINICAL DATA:  65 year old female with shortness of breath. EXAM: CHEST - 2  VIEW COMPARISON:  Chest radiograph dated 05/27/2020. FINDINGS: There is diffuse chronic interstitial coarsening. Faint left lung base density, likely atelectasis. Developing infiltrate is less likely. No focal consolidation, pleural effusion, or pneumothorax. The cardiac silhouette is within limits. No acute osseous pathology. IMPRESSION: Chronic interstitial coarsening. No focal consolidation. Electronically Signed   By: Anner Crete M.D.   On: 06/29/2020 16:08   CT Angio Neck W and/or Wo Contrast  Result Date: 06/29/2020 CLINICAL DATA:  Acute neuro deficit.  Rule out stroke. EXAM: CT ANGIOGRAPHY HEAD AND NECK TECHNIQUE: Multidetector CT imaging of the head and neck was performed using the standard protocol during bolus administration of intravenous contrast. Multiplanar CT image reconstructions and MIPs were obtained to evaluate the vascular anatomy. Carotid stenosis measurements (when applicable) are obtained utilizing NASCET criteria, using the distal internal carotid diameter as the denominator. CONTRAST:  170mL OMNIPAQUE IOHEXOL 350 MG/ML SOLN COMPARISON:  CT head 05/27/2020.  CT angio head neck 05/28/2020 FINDINGS: CT HEAD FINDINGS Brain: No evidence of acute infarction, hemorrhage, hydrocephalus, extra-axial collection or mass lesion/mass effect. Vascular: Negative for hyperdense vessel Skull: Negative Sinuses: Negative Orbits: Negative Review of the MIP images confirms the above findings CTA NECK FINDINGS Aortic arch: Standard branching. Imaged portion shows no evidence of aneurysm or dissection. No significant stenosis of the major arch vessel origins. Mild atherosclerotic calcification aortic arch. Right carotid system: Right carotid widely patent without stenosis or dissection Left carotid system: Atherosclerotic calcification left carotid bifurcation. Approximately 40% diameter stenosis proximal left internal carotid artery, unchanged from the prior study. Vertebral arteries: Both vertebral  arteries patent to the skull base without stenosis. Skeleton: Negative Other neck: Negative for mass or adenopathy in the neck. No soft tissue swelling. Upper chest: Mild apical emphysema bilaterally. No acute abnormality. Review of the MIP images confirms the above findings CTA HEAD FINDINGS Anterior circulation: Internal carotid artery widely patent through the skull base and cavernous segment with minimal atherosclerotic disease. Anterior and middle cerebral arteries widely patent bilaterally without significant stenosis or  large vessel occlusion. Posterior circulation: Both vertebral arteries patent to the basilar. PICA patent bilaterally. Basilar widely patent. Superior cerebellar and posterior cerebral arteries patent bilaterally without stenosis. Venous sinuses: Normal venous enhancement. Anatomic variants: None Review of the MIP images confirms the above findings IMPRESSION: 1. CT head negative for acute abnormality 2. Negative for intracranial large vessel occlusion or significant stenosis 3. 40% diameter stenosis proximal left internal carotid artery due to atherosclerotic disease. Right carotid and both vertebral arteries widely patent. Electronically Signed   By: Franchot Gallo M.D.   On: 06/29/2020 18:02    Procedures Procedures   Medications Ordered in ED Medications - No data to display  ED Course  I have reviewed the triage vital signs and the nursing notes.  Pertinent labs & imaging results that were available during my care of the patient were reviewed by me and considered in my medical decision making (see chart for details).  Clinical Course as of 06/30/20 1123  Thu Jun 29, 2020  1821 Reviewed the results of the imaging with the patient along with her lab results.  She is aware of the mild elevations of the LFTs and her doctor is working on that.  I offered her a repeat spinal tap to assess for continued inflammation/infection in her CSF.  She declines this.  She said she will  follow-up with her primary care doctor and is asking for information about a possible neurologist.  Return instructions discussed [MB]    Clinical Course User Index [MB] Hayden Rasmussen, MD   MDM Rules/Calculators/A&P                         This patient complains of generalized headache, swelling and neck; this involves an extensive number of treatment Options and is a complaint that carries with it a high risk of complications and Morbidity. The differential includes post meningitic headache, meningitis, lymphadenopathy, mass, intracranial bleed, vascular occlusion  I ordered, reviewed and interpreted labs, which included CBC with normal white count normal hemoglobin, chemistries normal, LFTs mildly elevated I ordered imaging studies which included CT angio head and neck and I independently    visualized and interpreted imaging which showed no acute findings other than noncritical obstruction left internal carotid Previous records obtained and reviewed in epic including prior admission in May for meningitis  After the interventions stated above, I reevaluated the patient and found patient to be hemodynamically stable and otherwise neurologically intact.  Reviewed results of work-up with her.  She is comfortable plan for follow-up with her primary provider.  Return instructions discussed   Final Clinical Impression(s) / ED Diagnoses Final diagnoses:  Generalized headache  Neck swelling  Elevated LFTs  Atherosclerosis of left carotid artery    Rx / DC Orders ED Discharge Orders     None        Hayden Rasmussen, MD 06/30/20 1126

## 2020-06-29 NOTE — ED Provider Notes (Signed)
Emergency Medicine Provider Triage Evaluation Note  Angela Mullins , a 65 y.o. female  was evaluated in triage.  Pt complains of increasing swelling lymph nodes.  She noted having tender swollen lymph nodes in her left axilla which have been present since she was COVID vaccinated.  She has now developed a new tender nodule on her left upper back and also at her left supraclavicular region.  She was sent by her provider for further evaluation.  Of note she was admitted here last month where she was treated for acute meningitis.  She does endorse mild shortness of breath with exertion, denies chest pain.  She is a former smoker.  Review of Systems  Positive: Increasing lymphadenopathy, mild cough, shortness of breath with exertion Negative: Fevers, chest pain.  Physical Exam  BP (!) 112/57   Pulse 85   Temp (!) 97.3 F (36.3 C) (Temporal)   Resp 12   Ht 5' 3.75" (1.619 m)   Wt 54.9 kg   SpO2 97%   BMI 20.93 kg/m  Gen:   Awake, no distress   Resp:  Normal effort  MSK:   Moves extremities without difficulty  Other:  Enlarged lymph nodes left axilla, fullness left supraclavicular, but no distinct node appreciated.  Soft nodule left upper back c/w lipoma  Medical Decision Making  Medically screening exam initiated at 3:40 PM.  Appropriate orders placed.  Angela Mullins was informed that the remainder of the evaluation will be completed by another provider, this initial triage assessment does not replace that evaluation, and the importance of remaining in the ED until their evaluation is complete.  Labs and cxr ordered pending room placement.   Angela Jefferson, PA-C 06/29/20 1547    Milton Ferguson, MD 06/30/20 [phone]

## 2020-07-27 ENCOUNTER — Ambulatory Visit: Admitting: Cardiology

## 2020-07-27 ENCOUNTER — Other Ambulatory Visit: Payer: Self-pay

## 2020-07-27 ENCOUNTER — Encounter: Payer: Self-pay | Admitting: Cardiology

## 2020-07-27 ENCOUNTER — Ambulatory Visit (HOSPITAL_COMMUNITY)
Admission: RE | Admit: 2020-07-27 | Discharge: 2020-07-27 | Disposition: A | Discharge status: Home / Self Care | Source: Ambulatory Visit | Attending: Family Medicine | Admitting: Family Medicine

## 2020-07-27 VITALS — BP 118/70 | HR 96 | Ht 64.0 in | Wt 123.6 lb

## 2020-07-27 DIAGNOSIS — R748 Abnormal levels of other serum enzymes: Secondary | ICD-10-CM | POA: Insufficient documentation

## 2020-07-27 DIAGNOSIS — E782 Mixed hyperlipidemia: Secondary | ICD-10-CM

## 2020-07-27 DIAGNOSIS — I6522 Occlusion and stenosis of left carotid artery: Secondary | ICD-10-CM

## 2020-07-27 NOTE — Patient Instructions (Signed)
Medication Instructions:  Your physician recommends that you continue on your current medications as directed. Please refer to the Current Medication list given to you today.  *If you need a refill on your cardiac medications before your next appointment, please call your pharmacy*   Lab Work: We have requested your most recent lab work from your primary care provider If you have labs (blood work) drawn today and your tests are completely normal, you will receive your results only by: MyChart Message (if you have MyChart) OR A paper copy in the mail If you have any lab test that is abnormal or we need to change your treatment, we will call you to review the results.   Testing/Procedures: None   Follow-Up: At [facility], you and your health needs are our priority.  As part of our continuing mission to provide you with exceptional heart care, we have created designated Provider Care Teams.  These Care Teams include your primary Cardiologist (physician) and Advanced Practice Providers (APPs -  Physician Assistants and Nurse Practitioners) who all work together to provide you with the care you need, when you need it.  We recommend signing up for the patient portal called "MyChart".  Sign up information is provided on this After Visit Summary.  MyChart is used to connect with patients for Virtual Visits (Telemedicine).  Patients are able to view lab/test results, encounter notes, upcoming appointments, etc.  Non-urgent messages can be sent to your provider as well.   To learn more about what you can do with MyChart, go to [email or web address].    Your next appointment:   6 month(s)  The format for your next appointment:   In Person  Provider:   Jonathan Branch, MD   Other Instructions    

## 2020-07-27 NOTE — Progress Notes (Signed)
Clinical Summary Ms. Yebra is a 65 y.o.female seen today as a new consult, referred by NP [facility] for the following medical problems.    Carotid stenosis - recent ER visit with neck swelling - had a CTA that showed left internal carotid 40% stenosis - 05/2020 CT Abd with aortic atherosclerosis  - on ASA/atorvastatin.  - she is stopped smoking     2. Hyperlipidemia - she is on statin, fenofibrate - crestor caused muscle weakness, changed to atorvastatin 06/2020 AST 49 ALT 61, chronically mildly elevated followed by GI - lipids followed by pcp, she reports long history of difficult to control cholesterol  Past Medical History:  Diagnosis Date   Anxiety    Aortic atherosclerosis ([place]) 05/28/2020   GERD (gastroesophageal reflux disease)    Hypercholesteremia    Hypertension    Meningitis    Vitamin D deficiency      Allergies  Allergen Reactions   Demerol [Meperidine Hcl] Other (See Comments)    Does not help   Quinine Derivatives Nausea And Vomiting   Rosuvastatin     Other reaction(s): joint and muscle pain     Current Outpatient Medications  Medication Sig Dispense Refill   acetaminophen (TYLENOL) 325 MG tablet Take 650 mg by mouth every 6 (six) hours as needed for mild pain.     aspirin EC 81 MG tablet Take 81 mg by mouth daily. Swallow whole.     atorvastatin (LIPITOR) 80 MG tablet Take 80 mg by mouth daily.     cetirizine (ZYRTEC) 10 MG tablet Take 10 mg by mouth daily.     clonazePAM (KLONOPIN) 0.5 MG tablet Take 0.5 mg by mouth 2 (two) times daily.     dexamethasone (DECADRON) 2 MG tablet Take 3 tablets (6 mg total) by mouth 2 (two) times daily. X 3 days, then 2 tabs twice daily x 3 days, then 1 tab twice a day x 3 days, then 1 tab daily x 3 days (Patient not taking: No sig reported) 39 tablet 0   diphenhydrAMINE (BENADRYL) 25 mg capsule Take 25 mg by mouth every 8 (eight) hours as needed for itching (Swelling).     doxycycline (VIBRA-TABS) 100 MG tablet  Take 1 tablet (100 mg total) by mouth every 12 (twelve) hours. (Patient not taking: No sig reported) 18 tablet 8   eletriptan (RELPAX) 40 MG tablet Take 40 mg by mouth every 2 (two) hours as needed for migraine.     ergocalciferol (VITAMIN D2) 1.25 MG (50000 UT) capsule Take 50,000 Units by mouth once a week.     fenofibrate micronized (LOFIBRA) 200 MG capsule Take 200 mg by mouth daily before breakfast.     HYDROcodone-acetaminophen (NORCO/VICODIN) 5-325 MG tablet Take 1 tablet by mouth every 6 (six) hours as needed for moderate pain. 20 tablet 0   ibuprofen (ADVIL) 200 MG tablet Take 400 mg by mouth daily as needed for headache.     lisinopril (ZESTRIL) 10 MG tablet Take 10 mg by mouth daily.     magnesium oxide (MAG-OX) 400 (240 Mg) MG tablet Take 1 tablet (400 mg total) by mouth daily. 30 tablet 0   Multiple Vitamins-Minerals (MULTIVITAMIN WITH MINERALS) tablet Take 1 tablet by mouth daily. 50 +     omeprazole (PRILOSEC) 40 MG capsule Take 40 mg by mouth 2 (two) times daily.     No current facility-administered medications for this visit.     Past Surgical History:  Procedure Laterality Date  BIOPSY  05/23/2020   Procedure: BIOPSY;  Surgeon: Harvel Quale, MD;  [facility];  Service: Gastroenterology;;  gastric esophageal   CHOLECYSTECTOMY     COLONOSCOPY WITH PROPOFOL N/A 05/23/2020   Procedure: COLONOSCOPY WITH PROPOFOL;  Surgeon: Harvel Quale, MD;  [facility];  Service: Gastroenterology;  Laterality: N/A;  10:45 AM   ESOPHAGEAL DILATION N/A 05/23/2020   Procedure: ESOPHAGEAL DILATION;  Surgeon: Harvel Quale, MD;  [facility];  Service: Gastroenterology;  Laterality: N/A;   ESOPHAGOGASTRODUODENOSCOPY (EGD) WITH PROPOFOL N/A 05/23/2020   Procedure: ESOPHAGOGASTRODUODENOSCOPY (EGD) WITH PROPOFOL;  Surgeon: Harvel Quale, MD;  [facility];  Service: Gastroenterology;  Laterality: N/A;    PELVIC FRACTURE SURGERY     POLYPECTOMY  05/23/2020   Procedure: POLYPECTOMY;  Surgeon: Harvel Quale, MD;  [facility];  Service: Gastroenterology;;  colon   WISDOM TOOTH EXTRACTION       Allergies  Allergen Reactions   Demerol [Meperidine Hcl] Other (See Comments)    Does not help   Quinine Derivatives Nausea And Vomiting   Rosuvastatin     Other reaction(s): joint and muscle pain      Family History  Problem Relation Age of Onset   Alzheimer's disease Mother    Heart disease Father    Diabetes Sister    Heart disease Sister    Hypertension Brother      Social History Ms. Harlin reports that she has quit smoking. She has never used smokeless tobacco. Ms. Bonito reports current alcohol use.   Review of Systems CONSTITUTIONAL: No weight loss, fever, chills, weakness or fatigue.  HEENT: Eyes: No visual loss, blurred vision, double vision or yellow sclerae.No hearing loss, sneezing, congestion, runny nose or sore throat.  SKIN: No rash or itching.  CARDIOVASCULAR: per hpi RESPIRATORY: No shortness of breath, cough or sputum.  GASTROINTESTINAL: No anorexia, nausea, vomiting or diarrhea. No abdominal pain or blood.  GENITOURINARY: No burning on urination, no polyuria NEUROLOGICAL: No headache, dizziness, syncope, paralysis, ataxia, numbness or tingling in the extremities. No change in bowel or bladder control.  MUSCULOSKELETAL: No muscle, back pain, joint pain or stiffness.  LYMPHATICS: No enlarged nodes. No history of splenectomy.  PSYCHIATRIC: No history of depression or anxiety.  ENDOCRINOLOGIC: No reports of sweating, cold or heat intolerance. No polyuria or polydipsia.  Marland Kitchen   Physical Examination Today's Vitals   07/27/20 1447  BP: 118/70  Pulse: 96  SpO2: 100%  Weight: 123 lb 9.6 oz (56.1 kg)  Height: 5\' 4"  (1.626 m)   Body mass index is 21.22 kg/m.  Gen: resting comfortably, no acute distress HEENT: no scleral icterus, pupils equal  round and reactive, no palptable cervical adenopathy,  CV: RRR, no m/r/g no jvd Resp: Clear to auscultation bilaterally GI: abdomen is soft, non-tender, non-distended, normal bowel sounds, no hepatosplenomegaly MSK: extremities are warm, no edema.  Skin: warm, no rash Neuro:  no focal deficits Psych: appropriate affect     Assessment and Plan  Carotid stenosis - mild asymptomatic disease, incidental findings from CT. Prior CTs have also show evidence of aortic atherosclersosis -she is already on ASA and statin - will need carotid US 1 year - continue risk factor modification  2. Hyperlipidemia - she reports long history of difficult to control cholesterol - did not tolerate crestor, has been on atorva 80 and fenofibrate - request pcp labs. Would look for aggressive LDL lowering given her evidence of atherosclerosis. If  additional LDL lowering needed could consider adding on zetia or potentially repatha depending on how high LDL is.    EKG today shows NSR   F/u 6 months primarily to f/u on cholesterol  Arnoldo Lenis, M.D.

## 2020-08-14 ENCOUNTER — Ambulatory Visit (INDEPENDENT_AMBULATORY_CARE_PROVIDER_SITE_OTHER): Admitting: Gastroenterology

## 2020-08-14 ENCOUNTER — Encounter (INDEPENDENT_AMBULATORY_CARE_PROVIDER_SITE_OTHER): Payer: Self-pay | Admitting: Gastroenterology

## 2020-08-14 ENCOUNTER — Other Ambulatory Visit: Payer: Self-pay

## 2020-08-14 VITALS — BP 138/81 | HR 81 | Temp 98.8°F | Ht 64.0 in | Wt 123.1 lb

## 2020-08-14 DIAGNOSIS — R1319 Other dysphagia: Secondary | ICD-10-CM

## 2020-08-14 DIAGNOSIS — K219 Gastro-esophageal reflux disease without esophagitis: Secondary | ICD-10-CM

## 2020-08-14 DIAGNOSIS — R7401 Elevation of levels of liver transaminase levels: Secondary | ICD-10-CM | POA: Diagnosis not present

## 2020-08-14 DIAGNOSIS — K760 Fatty (change of) liver, not elsewhere classified: Secondary | ICD-10-CM | POA: Diagnosis not present

## 2020-08-14 NOTE — Patient Instructions (Signed)
We have ordered some labs for you to have drawn related to your previous elevated liver enzymes. Info on [place] diet is provided as this can help decrease the fat in your liver seen on your recent ultrasound  Please continue taking omeprazole '40mg'$  twice daily  Colonoscopy due in May 2023.  Follow up in office in one year.

## 2020-08-14 NOTE — Progress Notes (Signed)
Referring Provider: Coolidge Breeze, FNP Primary Care Physician:  Coolidge Breeze, FNP Primary GI Physician: Dr. Jenetta Downer  Chief Complaint  Patient presents with   Follow-up    Follow up on gerd and has some concerns about EGD she had back in May. Concerns about neck swelling. Has one BM a day and appetite is good.    HPI:   Angela Mullins is a 65 y.o. female presenting today with a history of HTN, HLD, GERD and dysphagia who presents today for follow up of GERD and dysphagia, s/p EGD and colonoscopy in May 2022. Recent diagnosis of viral meningitis in May 2022.   States that a few days after her procedure she began having a severe headache (5/12) and had an episode where she was not making sense and slurring her words. EMS was called for evaluation, states that she declined transport due to her husband not wanting her to go to the ED. States that on Friday, her husband called her doctor to try and get patient some migraine medications (reports hx of migraines). Took two doses of relpax without relief of symptoms. States Saturday evening she went to ED for worsening h/a and altered mental status, stroke was rule out. Had LP, found to have viral meningitis.   Recent RUQ U/S ordered by PCP 07/27/20, revealed fatty liver. CMP in June AST 49 and ALT 61. Alk Phos and total bili WNL. Reports Rare alcohol use.   Dysphagia: s/p esophageal dilation in May 2022. States that swallowing has improved since dilation, no longer feels like she has an obstruction in her throat. She reports she still has some hesitancy when she swallows due to previous episodes of choking that make her nervous. States she does have some swelling on L side of neck/supraclavicular area, which is being evaluated by her PCP. Barium swallow study 05/18/2020 prior to EGD concerning for esophageal phase dysphagia.   GERD:Omeprazole 15m BID. States that reflux symptoms have improved since increasing to BID. She states that she tries not to  lay down for a while after eating. Rarely having breakthrough reflux at this time.   No red flag symptoms. Patient denies melena, hematochezia, nausea, vomiting, diarrhea, constipation,  odyonophagia, early satiety or weight loss.   Filed Weights   08/14/20 1005  Weight: 123 lb 1.6 oz (55.8 kg)   Last Colonoscopy: (05/23/20) One 10 mm polyp in the proximal ascending colon, Four 4 to 8 mm polyps in the descending colon, in the transverse colon and in the ascending colon (tubular adenomas), diverticulosis in the sigmoid colon. Non-bleeding internal hemorrhoids.  Last Endoscopy:(05/23/20) No endoscopic esophageal abnormality to explain patient's dysphagia. Esophagus dilated and Biopsied (normal) - Four spots with no bleeding and no stigmata of recent bleeding in the stomach. Biopsied (mild inflammation). Normal examined duodenum. H Pylori Negative  Recommendations:  Repeat surveillance colonoscopy in 2023  Past Medical History:  Diagnosis Date   Anxiety    Aortic atherosclerosis ([place] 05/28/2020   GERD (gastroesophageal reflux disease)    Hypercholesteremia    Hypertension    Meningitis    Vitamin D deficiency     Past Surgical History:  Procedure Laterality Date   BIOPSY  05/23/2020   Procedure: BIOPSY;  Surgeon: CHarvel Quale MD;  [facility];  Service: Gastroenterology;;  gastric esophageal   CHOLECYSTECTOMY     COLONOSCOPY WITH PROPOFOL N/A 05/23/2020   Procedure: COLONOSCOPY WITH PROPOFOL;  Surgeon: CHarvel Quale MD;  [facility];  Service:  Gastroenterology;  Laterality: N/A;  10:45 AM   ESOPHAGEAL DILATION N/A 05/23/2020   Procedure: ESOPHAGEAL DILATION;  Surgeon: Harvel Quale, MD;  [facility];  Service: Gastroenterology;  Laterality: N/A;   ESOPHAGOGASTRODUODENOSCOPY (EGD) WITH PROPOFOL N/A 05/23/2020   Procedure: ESOPHAGOGASTRODUODENOSCOPY (EGD) WITH PROPOFOL;  Surgeon: Harvel Quale, MD;   [facility];  Service: Gastroenterology;  Laterality: N/A;   PELVIC FRACTURE SURGERY     POLYPECTOMY  05/23/2020   Procedure: POLYPECTOMY;  Surgeon: Montez Morita, Quillian Quince, MD;  [facility];  Service: Gastroenterology;;  colon   WISDOM TOOTH EXTRACTION      Current Outpatient Medications  Medication Sig Dispense Refill   acetaminophen (TYLENOL) 325 MG tablet Take 650 mg by mouth every 6 (six) hours as needed for mild pain.     aspirin EC 81 MG tablet Take 81 mg by mouth daily. Swallow whole.     atorvastatin (LIPITOR) 80 MG tablet Take 80 mg by mouth daily.     cetirizine (ZYRTEC) 10 MG tablet Take 10 mg by mouth daily.     clonazePAM (KLONOPIN) 0.5 MG tablet Take 0.5 mg by mouth 2 (two) times daily.     diphenhydrAMINE (BENADRYL) 25 mg capsule Take 25 mg by mouth every 8 (eight) hours as needed for itching (Swelling).     eletriptan (RELPAX) 40 MG tablet Take 40 mg by mouth every 2 (two) hours as needed for migraine.     ergocalciferol (VITAMIN D2) 1.25 MG (50000 UT) capsule Take 50,000 Units by mouth once a week.     fenofibrate micronized (LOFIBRA) 200 MG capsule Take 200 mg by mouth daily before breakfast.     HYDROcodone-acetaminophen (NORCO/VICODIN) 5-325 MG tablet Take 1 tablet by mouth every 6 (six) hours as needed for moderate pain. 20 tablet 0   ibuprofen (ADVIL) 200 MG tablet Take 400 mg by mouth daily as needed for headache.     lisinopril (ZESTRIL) 5 MG tablet Take 5 mg by mouth daily.     Multiple Vitamins-Minerals (MULTIVITAMIN WITH MINERALS) tablet Take 1 tablet by mouth daily. 50 +     omeprazole (PRILOSEC) 40 MG capsule Take 40 mg by mouth 2 (two) times daily.     No current facility-administered medications for this visit.    Allergies as of 08/14/2020 - Review Complete 08/14/2020  Allergen Reaction Noted   Demerol [meperidine hcl] Other (See Comments) 05/15/2020   Quinine derivatives Nausea And Vomiting 05/10/2020   Rosuvastatin  06/29/2020     Family History  Problem Relation Age of Onset   Alzheimer's disease Mother    Heart disease Father    Diabetes Sister    Heart disease Sister    Hypertension Brother     Social History   Socioeconomic History   Marital status: Married    Spouse name: Not on file   Number of children: Not on file   Years of education: Not on file   Highest education level: Not on file  Occupational History   Not on file  Tobacco Use   Smoking status: Former   Smokeless tobacco: Never   Tobacco comments:    Quit 2021  Vaping Use   Vaping Use: Never used  Substance and Sexual Activity   Alcohol use: Yes    Comment: occasionally   Drug use: Never   Sexual activity: Not on file  Other Topics Concern   Not on file  Social History Narrative   Not on file  Social Determinants of Health   Financial Resource Strain: Not on file  Food Insecurity: Not on file  Transportation Needs: Not on file  Physical Activity: Not on file  Stress: Not on file  Social Connections: Not on file   Review of Systems: Gen: Denies fever, chills, anorexia. Denies fatigue, weakness, weight loss.  CV: Denies chest pain, palpitations, syncope, peripheral edema, and claudication. Resp: Denies dyspnea at rest, cough, wheezing, coughing up blood, and pleurisy. GI: Denies vomiting blood, jaundice, and fecal incontinence. Denies dysphagia or odynophagia. Derm: Denies rash, itching, dry skin Psych: Denies depression, anxiety, memory loss, confusion. No homicidal or suicidal ideation.  Heme: Denies bruising, bleeding, and enlarged lymph nodes.  Physical Exam: BP 138/81 (BP Location: Right Arm, Patient Position: Sitting, Cuff Size: Normal)   Pulse 81   Temp 98.8 F (37.1 C) (Oral)   Ht '5\' 4"'  (1.626 m)   Wt 123 lb 1.6 oz (55.8 kg)   BMI 21.13 kg/m  General:   Alert and oriented. No distress noted. Pleasant and cooperative.  Mouth:  Oral mucosa pink and moist. Good dentition. No lesions. Heart: Normal rate  and rhythm, s1 and s2 heart sounds present.  Lungs: Clear lung sounds in all lobes. Respirations equal and unlabored. Abdomen:  +BS, soft, non-tender and non-distended. No rebound or guarding. No HSM or masses noted. Derm: No palmar erythema or jaundice Msk:  Symmetrical without gross deformities. Normal posture. Mild edema to L neck/supraclavicular area without presence of lymphadenopathy.  Extremities:  Without edema. Neurologic:  Alert and  oriented x4 Psych:  Alert and cooperative. Normal mood and affect.  ASSESSMENT: Angela Mullins is a 65 y.o. female presenting today for follow up of dysphagia s/p EGD with dilation in May and GERD. Patient had recent viral meningitis in May 2022 and has been recovering since then. She reports some swelling to L side of neck as well that began shortly after discharge from hospital in May.  Dysphagia much improved since EGD with esophageal dilation in May 2022. Rarely has issues with food passing. Some swallowing "hesitancy" due to being weary from previous episodes of choking, but no longer feels like there is an obstruction in her throat like she did prior to EGD and no issues with food or pills becoming stuck. Recent barium swallow 05/18/20 revealed esophageal phase dysphagia.   GERD symptoms much improved since increasing omeprazole 40 to twice daily. Discussed stepping back down to once daily, however, patient feels that symptoms were not as well controlled prior to adding second dose. We will continue BID for a year and re-evaluate for possibility of decreasing back to once daily.   Fatty liver revealed on recent US ordered by her PCP 07/27/20. Transaminases slightly elevated (6/16) AST 49 and ALT 61. Alk phos and total bili WNL. Will check Hep A and B antibodies, acute hepatitis panel, ANA, smooth muscle, IgG and iron studies to rule out any other underlying causes of elevated transaminases.   Patient questioned if recent dx of meningitis or swelling in neck  could have been related to EGD/colonoscopy performed just a few days prior to symptoms beginning. We discussed that the procedures nor medications given for sedation would illicit a reaction of meningitis as this was a viral illness.   PCP has been following patient's neck/supraclavicular swelling. CT imagining of neck in June was indeterminate of cause of swelling, cervical lymphadenopathy was not present on scan, however, patient did have incidental finding of 40% stenosis of proximal left internal carotid artery  on CT. I Recommended that patient continue to have PCP follow swelling of her neck as etiology is unclear, but, not GI related.  PLAN:  Continue Omeprazole 60m BID 2. Continue chewing precautions, chewing well, small bites and liquids after bites 3. Continue to stay upright 1-2 hours after eating to prevent reflux symptoms 4. Will check hep A and B, acute hepatitis panel, ANA, smooth muscle, IgG and iron studies  5. Please continue to follow with PCP regarding swelling of Left neck 6. Plan for repeat surveillance colonoscopy in May 2023  Follow Up: 1 year  Case discussed with Dr. CJenetta Downerwho is in agreement with plan, as outlined above.   Zettie Gootee L. CAlver Sorrow MSN, APRN, AGNP-C Adult-Gerontology Nurse Practitioner [facility]

## 2020-08-17 ENCOUNTER — Telehealth (INDEPENDENT_AMBULATORY_CARE_PROVIDER_SITE_OTHER): Payer: Self-pay | Admitting: Gastroenterology

## 2020-08-17 LAB — FERRITIN: Ferritin: 47 ng/mL (ref 16–288)

## 2020-08-17 LAB — ANTI-SMOOTH MUSCLE ANTIBODY, IGG: Actin (Smooth Muscle) Antibody (IGG): <20 U (ref <20)

## 2020-08-17 LAB — HEPATITIS PANEL, ACUTE
Hep A IgM: NONREACTIVE
Hep B C IgM: NONREACTIVE
Hepatitis B Surface Ag: NONREACTIVE
Hepatitis C Ab: NONREACTIVE
SIGNAL TO CUT-OFF: 0 (ref <1.00)

## 2020-08-17 LAB — HEPATITIS A ANTIBODY, TOTAL: Hepatitis A AB,Total: NONREACTIVE

## 2020-08-17 LAB — HEPATITIS B SURFACE ANTIBODY,QUALITATIVE: Hep B S Ab: NONREACTIVE

## 2020-08-17 LAB — IGG: IgG (Immunoglobin G), Serum: 708 mg/dL (ref 600–1540)

## 2020-08-17 LAB — ANA: Anti Nuclear Antibody (ANA): NEGATIVE

## 2020-08-17 LAB — IRON, TOTAL/TOTAL IRON BINDING CAP
%SAT: 30 % (calc) (ref 16–45)
Iron: 147 ug/dL (ref 45–160)
TIBC: 494 mcg/dL (calc) — ABNORMAL HIGH (ref 250–450)

## 2020-08-17 NOTE — Telephone Encounter (Signed)
Patient returned your call.

## 2021-01-30 ENCOUNTER — Ambulatory Visit: Admitting: Cardiology

## 2021-04-19 ENCOUNTER — Encounter: Payer: Self-pay | Admitting: Cardiology

## 2021-04-19 ENCOUNTER — Ambulatory Visit: Admitting: Cardiology

## 2021-04-19 ENCOUNTER — Encounter: Payer: Self-pay | Admitting: *Deleted

## 2021-04-19 VITALS — BP 114/68 | HR 66 | Ht 64.0 in | Wt 125.0 lb

## 2021-04-19 DIAGNOSIS — E782 Mixed hyperlipidemia: Secondary | ICD-10-CM | POA: Diagnosis not present

## 2021-04-19 DIAGNOSIS — I6522 Occlusion and stenosis of left carotid artery: Secondary | ICD-10-CM

## 2021-04-19 MED ORDER — EZETIMIBE 10 MG PO TABS: 10.0000 mg | ORAL_TABLET | Freq: Every day | ORAL | 3 refills | Status: DC

## 2021-04-19 NOTE — Progress Notes (Signed)
? ? ? ?Clinical Summary ?Angela Mullins is a 66 y.o.female seen today for follow up of the following medical problems.  ? ?Carotid stenosis ?- recent ER visit with neck swelling ?- had a CTA that showed left internal carotid 40% stenosis ?- 05/2020 CT Abd with aortic atherosclerosis ?  ?- on ASA/atorvastatin.  ?- she is stopped smoking ?- no neuro symptoms ?  ?  ?  ?  ?2. Hyperlipidemia ?- she is on statin, fenofibrate ?- crestor caused muscle weakness, changed to atorvastatin ?06/2020 AST 49 ALT 61, chronically mildly elevated followed by GI ?- lipids followed by pcp, she reports long history of difficult to control cholesterol ? ?05/2020 LDL 133 ?- compliant with atorvastatin ? ? ?Past Medical History:  ?Diagnosis Date  ? Anxiety   ? Aortic atherosclerosis ([place]) 05/28/2020  ? GERD (gastroesophageal reflux disease)   ? Hypercholesteremia   ? Hypertension   ? Meningitis   ? Vitamin D deficiency   ? ? ? ?Allergies  ?Allergen Reactions  ? Demerol [Meperidine Hcl] Other (See Comments)  ?  Does not help  ? Quinine Derivatives Nausea And Vomiting  ? Rosuvastatin   ?  Other reaction(s): joint and muscle pain  ? ? ? ?Current Outpatient Medications  ?Medication Sig Dispense Refill  ? acetaminophen (TYLENOL) 325 MG tablet Take 650 mg by mouth every 6 (six) hours as needed for mild pain.    ? aspirin EC 81 MG tablet Take 81 mg by mouth daily. Swallow whole.    ? atorvastatin (LIPITOR) 80 MG tablet Take 80 mg by mouth daily.    ? cetirizine (ZYRTEC) 10 MG tablet Take 10 mg by mouth daily.    ? clonazePAM (KLONOPIN) 0.5 MG tablet Take 0.5 mg by mouth 2 (two) times daily.    ? diphenhydrAMINE (BENADRYL) 25 mg capsule Take 25 mg by mouth every 8 (eight) hours as needed for itching (Swelling).    ? eletriptan (RELPAX) 40 MG tablet Take 40 mg by mouth every 2 (two) hours as needed for migraine.    ? ergocalciferol (VITAMIN D2) 1.25 MG (50000 UT) capsule Take 50,000 Units by mouth once a week.    ? fenofibrate micronized (LOFIBRA) 200 MG  capsule Take 200 mg by mouth daily before breakfast.    ? HYDROcodone-acetaminophen (NORCO/VICODIN) 5-325 MG tablet Take 1 tablet by mouth every 6 (six) hours as needed for moderate pain. 20 tablet 0  ? ibuprofen (ADVIL) 200 MG tablet Take 400 mg by mouth daily as needed for headache.    ? lisinopril (ZESTRIL) 5 MG tablet Take 5 mg by mouth daily.    ? Multiple Vitamins-Minerals (MULTIVITAMIN WITH MINERALS) tablet Take 1 tablet by mouth daily. 50 +    ? omeprazole (PRILOSEC) 40 MG capsule Take 40 mg by mouth 2 (two) times daily.    ? ?No current facility-administered medications for this visit.  ? ? ? ?Past Surgical History:  ?Procedure Laterality Date  ? BIOPSY  05/23/2020  ? Procedure: BIOPSY;  Surgeon: Montez Morita, Quillian Quince, MD;  Location: [facility];  Service: Gastroenterology;;  gastric ?esophageal  ? CHOLECYSTECTOMY    ? COLONOSCOPY WITH PROPOFOL N/A 05/23/2020  ? castaneda: One 10 mm polyp in the proximal ascending colon, Four 4 to 8 mm polyps in the descending colon, in the transverse colon and in the ascending colon (tubular adenomas), diverticulosis in the sigmoid colon. Non-bleeding internal hemorrhoids.  ? ESOPHAGEAL DILATION N/A 05/23/2020  ? Procedure: ESOPHAGEAL DILATION;  Surgeon: Montez Morita, Quillian Quince,  MD;  Location: [facility];  Service: Gastroenterology;  Laterality: N/A;  ? ESOPHAGOGASTRODUODENOSCOPY (EGD) WITH PROPOFOL N/A 05/23/2020  ? castaneda:No endoscopic esophageal abnormality to explain patient's dysphagia. Esophagus dilated and Biopsied (normal)  ? PELVIC FRACTURE SURGERY    ? POLYPECTOMY  05/23/2020  ? Procedure: POLYPECTOMY;  Surgeon: Harvel Quale, MD;  Location: [facility];  Service: Gastroenterology;;  colon  ? WISDOM TOOTH EXTRACTION    ? ? ? ?Allergies  ?Allergen Reactions  ? Demerol [Meperidine Hcl] Other (See Comments)  ?  Does not help  ? Quinine Derivatives Nausea And Vomiting  ? Rosuvastatin   ?  Other reaction(s): joint and muscle pain   ? ? ? ? ?Family History  ?Problem Relation Age of Onset  ? Alzheimer's disease Mother   ? Heart disease Father   ? Diabetes Sister   ? Heart disease Sister   ? Hypertension Brother   ? ? ? ?Social History ?Ms. Geffre reports that she has quit smoking. She has never used smokeless tobacco. ?Ms. Kaseman reports current alcohol use. ? ? ?Review of Systems ?CONSTITUTIONAL: No weight loss, fever, chills, weakness or fatigue.  ?HEENT: Eyes: No visual loss, blurred vision, double vision or yellow sclerae.No hearing loss, sneezing, congestion, runny nose or sore throat.  ?SKIN: No rash or itching.  ?CARDIOVASCULAR: per hpi ?RESPIRATORY: No shortness of breath, cough or sputum.  ?GASTROINTESTINAL: No anorexia, nausea, vomiting or diarrhea. No abdominal pain or blood.  ?GENITOURINARY: No burning on urination, no polyuria ?NEUROLOGICAL: No headache, dizziness, syncope, paralysis, ataxia, numbness or tingling in the extremities. No change in bowel or bladder control.  ?MUSCULOSKELETAL: No muscle, back pain, joint pain or stiffness.  ?LYMPHATICS: No enlarged nodes. No history of splenectomy.  ?PSYCHIATRIC: No history of depression or anxiety.  ?ENDOCRINOLOGIC: No reports of sweating, cold or heat intolerance. No polyuria or polydipsia.  ?. ? ? ?Physical Examination ?Today's Vitals  ? 04/19/21 1038  ?BP: 114/68  ?Pulse: 66  ?SpO2: 96%  ?Weight: 125 lb (56.7 kg)  ?Height: '5\' 4"'$  (1.626 m)  ? ?Body mass index is 21.46 kg/m?. ? ?Gen: resting comfortably, no acute distress ?HEENT: no scleral icterus, pupils equal round and reactive, no palptable cervical adenopathy,  ?CV: RRR, no m/r/g no jvd ?Resp: Clear to auscultation bilaterally ?GI: abdomen is soft, non-tender, non-distended, normal bowel sounds, no hepatosplenomegaly ?MSK: extremities are warm, no edema.  ?Skin: warm, no rash ?Neuro:  no focal deficits ?Psych: appropriate affect ? ? ?Assessment and Plan  ?Carotid stenosis ?- mild asymptomatic disease, incidental findings from CT.  Prior CTs have also show evidence of aortic atherosclersosis ?-repeat carotid US 07/2021 ?- continue risk factor modification ?  ?2. Hyperlipidemia ?- she reports long history of difficult to control cholesterol ?- did not tolerate crestor, has been on atorva 80 and fenofibrate ?- LDL 133 by last check, would look to get at least below 100. Add zetia '10mg'$  daily to current regimen. ? ? ?F/u 1 year ? ? ?Arnoldo Lenis, M.D. ?

## 2021-04-19 NOTE — Patient Instructions (Addendum)
Medication Instructions:  ?Start Zetia 10 mg daily ? ? ?Labwork: ?None today ? ?Testing/Procedures: ?Your physician has requested that you have a carotid duplex in July. This test is an ultrasound of the carotid arteries in your neck. It looks at blood flow through these arteries that supply the brain with blood. Allow one hour for this exam. There are no restrictions or special instructions. ? ? ?Follow-Up: ?1 year ? ?Any Other Special Instructions Will Be Listed Below (If Applicable). ? ?If you need a refill on your cardiac medications before your next appointment, please call your pharmacy. ?

## 2021-05-01 ENCOUNTER — Encounter (INDEPENDENT_AMBULATORY_CARE_PROVIDER_SITE_OTHER): Payer: Self-pay | Admitting: *Deleted

## 2021-08-01 ENCOUNTER — Ambulatory Visit (HOSPITAL_COMMUNITY)
Admission: RE | Admit: 2021-08-01 | Discharge: 2021-08-01 | Disposition: A | Discharge status: Home / Self Care | Source: Ambulatory Visit | Attending: Cardiology | Admitting: Cardiology

## 2021-08-01 DIAGNOSIS — I6522 Occlusion and stenosis of left carotid artery: Secondary | ICD-10-CM | POA: Diagnosis present

## 2021-08-07 ENCOUNTER — Telehealth: Payer: Self-pay | Admitting: Cardiology

## 2021-08-07 NOTE — Telephone Encounter (Signed)
Will fwd to provider for results.  

## 2021-08-07 NOTE — Telephone Encounter (Signed)
Patient called in wanting to know her results. Please advise

## 2022-03-30 ENCOUNTER — Other Ambulatory Visit: Payer: Self-pay | Admitting: Cardiology

## 2022-05-30 ENCOUNTER — Other Ambulatory Visit: Payer: Self-pay | Admitting: Cardiology

## 2022-06-11 ENCOUNTER — Encounter: Payer: Self-pay | Admitting: Internal Medicine

## 2022-06-11 ENCOUNTER — Telehealth: Payer: Self-pay | Admitting: Cardiology

## 2022-06-11 MED ORDER — EZETIMIBE 10 MG PO TABS: 10.0000 mg | ORAL_TABLET | Freq: Every day | ORAL | 1 refills | Status: DC

## 2022-06-11 NOTE — Telephone Encounter (Signed)
Spoke with patient,she will make f/u,only wants to see Dr.Branch  Understands that she will remain on Zetia.

## 2022-06-11 NOTE — Telephone Encounter (Signed)
Error

## 2022-06-11 NOTE — Telephone Encounter (Signed)
Pt c/o medication issue:  1. Name of Medication:   ezetimibe (ZETIA) 10 MG tablet   2. How are you currently taking this medication (dosage and times per day)?   As prescribed  3. Are you having a reaction (difficulty breathing--STAT)?   No  4. What is your medication issue?   Patient stated she is almost out of this medication and wants to know if she still needs to continue taking this medication.

## 2022-07-23 IMAGING — CT CT VENOGRAM HEAD
2 of 7 series · 7 of 36 positions shown · IV contrast (Omnipaque or Isovue)
Comparison: Prior noncontrast CT from 05/27/2020.

CLINICAL DATA: Initial evaluation for acute headache.

EXAM:
CT ANGIOGRAPHY HEAD AND NECK
CT VENOGRAM OF THE HEAD WITH CONTRAST.
TECHNIQUE: Multidetector CT imaging of the head and neck was performed using
the standard protocol during bolus administration of intravenous
contrast. Multiplanar CT image reconstructions and MIPs were
obtained to evaluate the vascular anatomy. Carotid stenosis
measurements (when applicable) are obtained utilizing NASCET
criteria, using the distal internal carotid diameter as the
denominator.
CONTRAST:  75mL OMNIPAQUE IOHEXOL 350 MG/ML SOLN

[Series 7: ax thins · axial · 0.39mm/px · z∈[-159,-55]mm · 2 of 317 slices shown]
[im 106/317  soft-tissue]
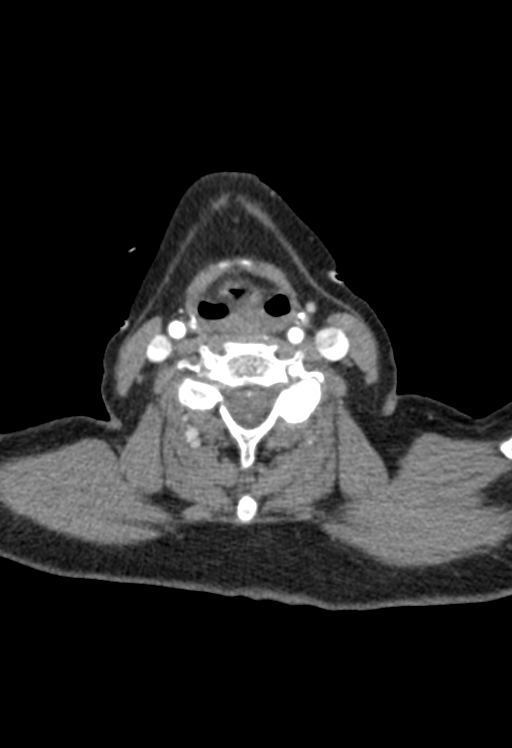
[im 211/317  bone]
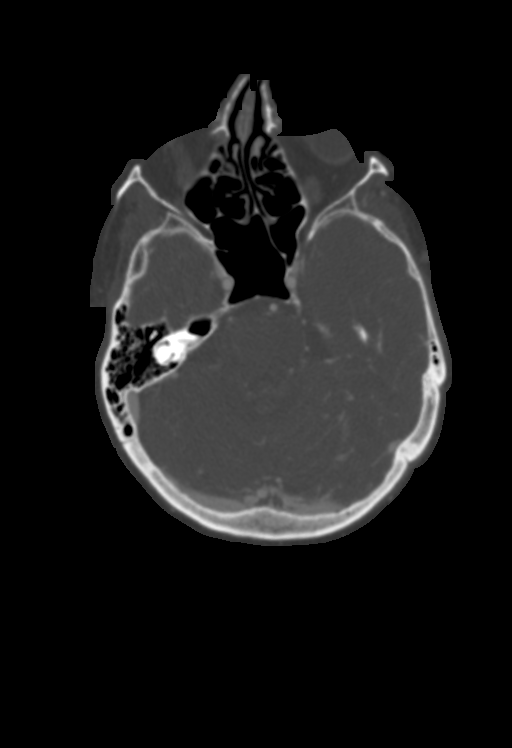

[Series 17: ax thin · axial · 0.38mm/px · z∈[-51,+58]mm · 5 of 568 slices shown]
[im 95/568  soft-tissue]
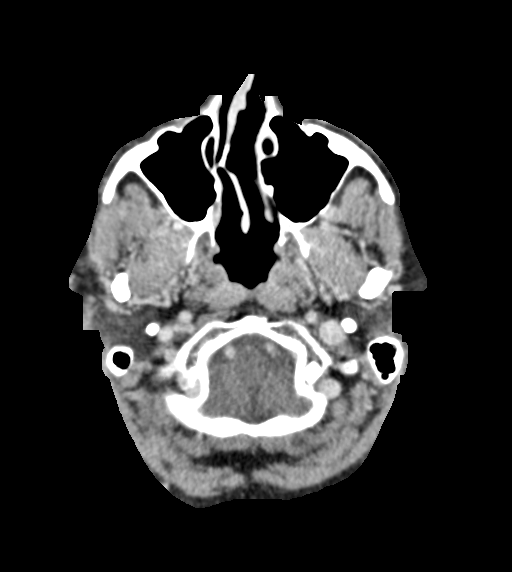
[im 190/568  soft-tissue]
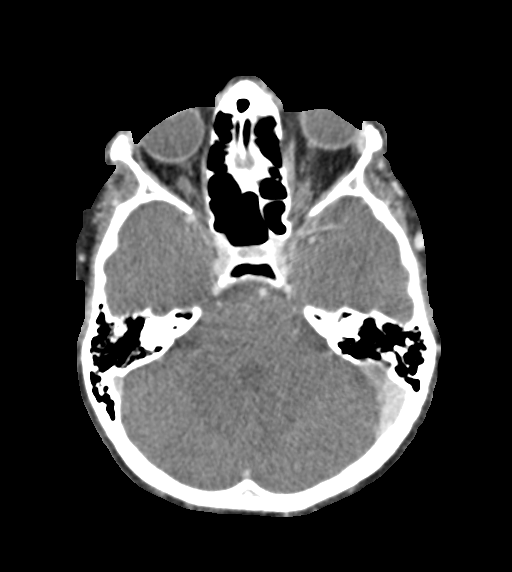
[im 284/568  soft-tissue]
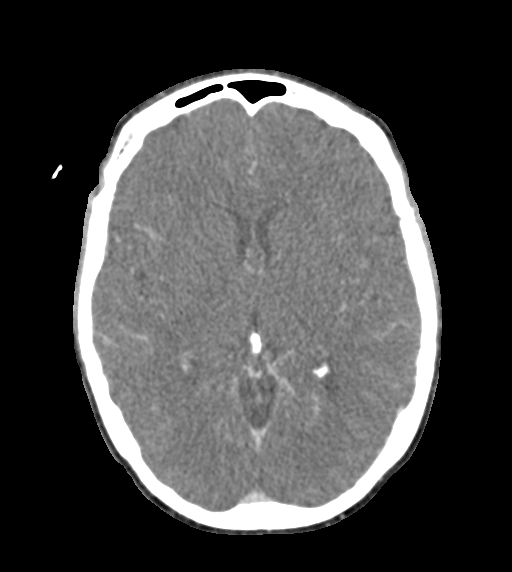
[im 379/568  soft-tissue]
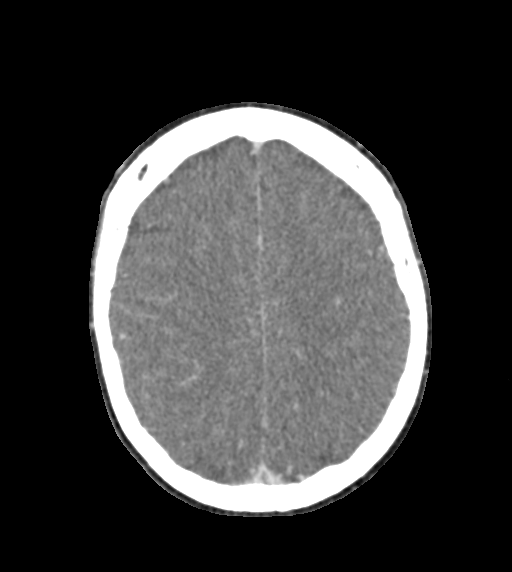
[im 473/568  soft-tissue]
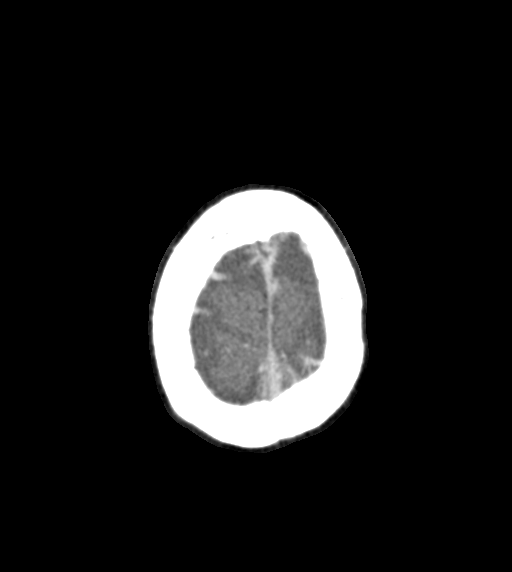

[7 of 36 positions shown; findings below may reference images not displayed]

FINDINGS: CTA NECK FINDINGS

Aortic arch: Visualized aortic arch normal caliber with normal
branch pattern. Mild atheromatous change about the arch and origin
of the great vessels without hemodynamically significant stenosis.

Right carotid system: Right common and internal carotid arteries
widely patent without stenosis, dissection or occlusion. Minimal
atheromatous plaque about the right bifurcation without stenosis.

Left carotid system: Left CCA patent from its origin to the
bifurcation without stenosis. Bulky calcified plaque about the left
carotid bulb with associated stenosis of up to 40% by NASCET
criteria. Left ICA patent distally without stenosis, dissection or
occlusion.

Vertebral arteries: Both vertebral arteries arise from the
subclavian arteries. No proximal subclavian artery stenosis.
Vertebral arteries patent within the neck without stenosis,
dissection or occlusion.

Skeleton: No visible acute osseous finding. No discrete or worrisome
osseous lesions.

Other neck: No other acute soft tissue abnormality within the neck.
No mass or adenopathy.

Upper chest: Advanced emphysema. Superimposed scattered patchy
opacities within the posterior left greater than right lungs, could
reflect atelectasis or infiltrates.

Review of the MIP images confirms the above findings

CTA HEAD FINDINGS

Anterior circulation: Petrous segments widely patent bilaterally.
Minimal atheromatous change within the carotid siphons without
significant stenosis or other abnormality. A1 segments widely
patent. Normal anterior communicating artery complex. Anterior
cerebral arteries patent to their distal aspects without stenosis.
No M1 stenosis or occlusion. Right M1 bifurcates early. Bifurcations
M cells within normal limits. Distal MCA branches well perfused and
symmetric.

Posterior circulation: Both V4 segments patent to the
vertebrobasilar junction without stenosis. Both PICA origins patent
and normal. Basilar patent to its distal aspect without stenosis.
Superior cerebellar arteries patent bilaterally. Both PCAs primarily
supplied via the basilar well perfused or distal aspects. Small
right posterior communicating artery noted.

Venous sinuses: Normal enhancement seen throughout the superior
sagittal sinus to the torcula. Torcula itself appears patent.
Transverse and sigmoid sinuses are patent as are the visualized
proximal internal jugular veins. Straight sinus, vein of Rakhsh,
internal cerebral veins, and basal veins of Severin appear patent.
No evidence for dural sinus thrombosis. Cavernous sinus appears
patent. Superior orbital veins symmetric and within normal limits.

Anatomic variants: None significant. No aneurysm or other vascular
abnormality.

Review of the MIP images confirms the above findings
IMPRESSION: 1. Negative CTA for large vessel occlusion. No high-grade or
correctable stenosis within the major arterial vasculature of the
head and neck. No aneurysm.
2. Bulky calcified plaque about the left carotid bulb with
associated stenosis of up to 40% by NASCET criteria.
3. Negative CT venogram.  No evidence for dural sinus thrombosis.
4. Emphysema ([id number]). Superimposed patchy opacities within the
posterior left greater than right lungs could reflect atelectasis or
infiltrates. Correlation with physical exam and any potential
symptomatology recommended.

## 2022-07-24 IMAGING — RF DG FLUORO GUIDE SPINAL/SI JT INJ*L*
1 series · 1 of 1 positions shown · non-contrast
Comparison: Head CT 05/27/2020

CLINICAL DATA: Headache

EXAM:
DIAGNOSTIC LUMBAR PUNCTURE UNDER FLUOROSCOPIC GUIDANCE

[Series 2: cp_standard · 0.26mm/px · 1 of 1 slices shown]
[im 1/1]
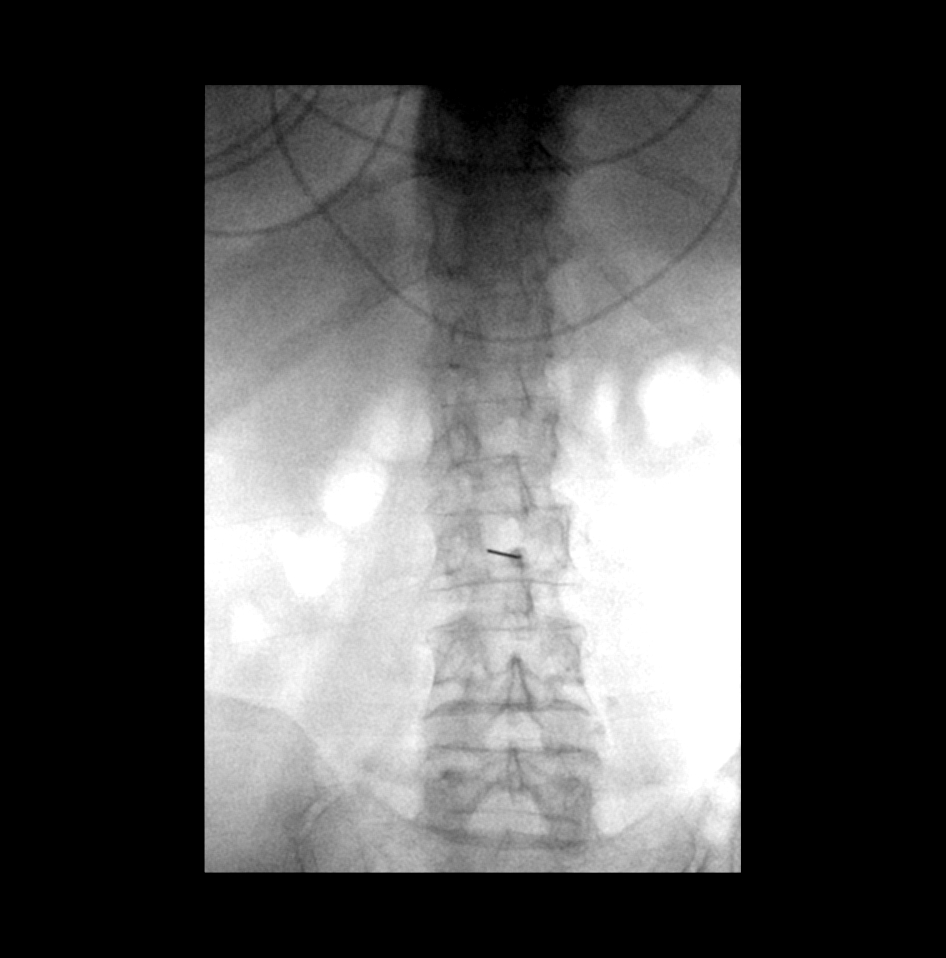

[1 of 1 positions shown; findings below may reference images not displayed]

FLUOROSCOPY TIME:  Fluoroscopy Time:  20 seconds

Radiation Exposure Index (if provided by the fluoroscopic device):
1.1 mGy

Number of Acquired Spot Images: 0

PROCEDURE:
Informed consent was obtained from the patient prior to the
procedure, including potential complications of headache, allergy,
and pain. With the patient prone, the lower back was prepped with
Betadine. 1% Lidocaine was used for local anesthesia. Lumbar
puncture was performed at the L2-3 level using a 20 gauge gauge
needle with return of clear CSF with an opening pressure of 35 cm
water. Twelve ml of CSF were obtained for laboratory studies. The
patient tolerated the procedure well and there were no apparent
complications.
IMPRESSION: Technically successful lumbar puncture under fluoroscopic guidance
as above.

## 2022-10-15 ENCOUNTER — Encounter: Payer: Self-pay | Admitting: Cardiology

## 2022-10-15 ENCOUNTER — Telehealth: Payer: Self-pay

## 2022-10-15 ENCOUNTER — Ambulatory Visit: Payer: Medicare PPO | Attending: Cardiology | Admitting: Cardiology

## 2022-10-15 VITALS — BP 108/70 | HR 73 | Ht 64.0 in | Wt 129.0 lb

## 2022-10-15 DIAGNOSIS — I6523 Occlusion and stenosis of bilateral carotid arteries: Secondary | ICD-10-CM | POA: Diagnosis not present

## 2022-10-15 DIAGNOSIS — E782 Mixed hyperlipidemia: Secondary | ICD-10-CM | POA: Diagnosis not present

## 2022-10-15 NOTE — Patient Instructions (Addendum)
Medication Instructions:  Your physician recommends that you continue on your current medications as directed. Please refer to the Current Medication list given to you today.  *If you need a refill on your cardiac medications before your next appointment, please call your pharmacy*   Lab Work: None If you have labs (blood work) drawn today and your tests are completely normal, you will receive your results only by: MyChart Message (if you have MyChart) OR A paper copy in the mail If you have any lab test that is abnormal or we need to change your treatment, we will call you to review the results.   Testing/Procedures: None   Follow-Up: At [facility], you and your health needs are our priority.  As part of our continuing mission to provide you with exceptional heart care, we have created designated Provider Care Teams.  These Care Teams include your primary Cardiologist (physician) and Advanced Practice Providers (APPs -  Physician Assistants and Nurse Practitioners) who all work together to provide you with the care you need, when you need it.  We recommend signing up for the patient portal called "MyChart".  Sign up information is provided on this After Visit Summary.  MyChart is used to connect with patients for Virtual Visits (Telemedicine).  Patients are able to view lab/test results, encounter notes, upcoming appointments, etc.  Non-urgent messages can be sent to your provider as well.   To learn more about what you can do with MyChart, go to [email or web address].    Your next appointment:   1 year(s)  Provider:   You may see Dina Rich, MD or one of the following Advanced Practice Providers on your designated Care Team:   Randall An, PA-C  Jacolyn Reedy, [place]     Other Instructions Heart-Healthy Eating Plan Many factors influence your heart health, including eating and exercise habits. Heart health is also called coronary health. Coronary risk  increases with abnormal blood fat (lipid) levels. A heart-healthy eating plan includes limiting unhealthy fats, increasing healthy fats, limiting salt (sodium) intake, and making other diet and lifestyle changes.  Cooking Cook foods using methods other than frying. Baking, boiling, grilling, and broiling are all good options. Other ways to reduce fat include: Removing the skin from poultry. Removing all visible fats from meats. Steaming vegetables in water or broth. Meal planning  At meals, imagine dividing your plate into fourths: Fill one-half of your plate with vegetables and green salads. Fill one-fourth of your plate with whole grains. Fill one-fourth of your plate with lean protein foods. Eat 2-4 cups of vegetables per day. One cup of vegetables equals 1 cup (91 g) broccoli or cauliflower florets, 2 medium carrots, 1 large bell pepper, 1 large sweet potato, 1 large tomato, 1 medium white potato, 2 cups (150 g) raw leafy greens. Eat 1-2 cups of fruit per day. One cup of fruit equals 1 small apple, 1 large banana, 1 cup (237 g) mixed fruit, 1 large orange,  cup (82 g) dried fruit, 1 cup (240 mL) 100% fruit juice. Eat more foods that contain soluble fiber. Examples include apples, broccoli, carrots, beans, peas, and barley. Aim to get 25-30 g of fiber per day. Increase your consumption of legumes, nuts, and seeds to 4-5 servings per week. One serving of dried beans or legumes equals  cup (90 g) cooked, 1 serving of nuts is  oz (12 almonds, 24 pistachios, or 7 walnut halves), and 1 serving of seeds equals  oz (8 g). Fats Choose  healthy fats more often. Choose monounsaturated and polyunsaturated fats, such as olive and canola oils, avocado oil, flaxseeds, walnuts, almonds, and seeds. Eat more omega-3 fats. Choose salmon, mackerel, sardines, tuna, flaxseed oil, and ground flaxseeds. Aim to eat fish at least 2 times each week. Check food labels carefully to identify foods with trans fats or  high amounts of saturated fat. Limit saturated fats. These are found in animal products, such as meats, butter, and cream. Plant sources of saturated fats include palm oil, palm kernel oil, and coconut oil. Avoid foods with partially hydrogenated oils in them. These contain trans fats. Examples are stick margarine, some tub margarines, cookies, crackers, and other baked goods. Avoid fried foods. General information Eat more home-cooked food and less restaurant, buffet, and fast food. Limit or avoid alcohol. Limit foods that are high in added sugar and simple starches such as foods made using white refined flour (white breads, pastries, sweets). Lose weight if you are overweight. Losing just 5-10% of your body weight can help your overall health and prevent diseases such as diabetes and heart disease. Monitor your sodium intake, especially if you have high blood pressure. Talk with your health care provider about your sodium intake. Try to incorporate more vegetarian meals weekly. What foods should I eat? Fruits All fresh, canned (in natural juice), or frozen fruits. Vegetables Fresh or frozen vegetables (raw, steamed, roasted, or grilled). Green salads. Grains Most grains. Choose whole wheat and whole grains most of the time. Rice and pasta, including brown rice and pastas made with whole wheat. Meats and other proteins Lean, well-trimmed beef, veal, pork, and lamb. Chicken and [place] without skin. All fish and shellfish. Wild duck, rabbit, pheasant, and venison. Egg whites or low-cholesterol egg substitutes. Dried beans, peas, lentils, and tofu. Seeds and most nuts. Dairy Low-fat or nonfat cheeses, including ricotta and mozzarella. Skim or 1% milk (liquid, powdered, or evaporated). Buttermilk made with low-fat milk. Nonfat or low-fat yogurt. Fats and oils Non-hydrogenated (trans-free) margarines. Vegetable oils, including soybean, sesame, sunflower, olive, avocado, peanut, safflower, corn,  canola, and cottonseed. Salad dressings or mayonnaise made with a vegetable oil. Beverages Water (mineral or sparkling). Coffee and tea. Unsweetened ice tea. Diet beverages. Sweets and desserts Sherbet, gelatin, and fruit ice. Small amounts of dark chocolate. Limit all sweets and desserts. Seasonings and condiments All seasonings and condiments. The items listed above may not be a complete list of foods and beverages you can eat. Contact a dietitian for more options. What foods should I avoid? Fruits Canned fruit in heavy syrup. Fruit in cream or butter sauce. Fried fruit. Limit coconut. Vegetables Vegetables cooked in cheese, cream, or butter sauce. Fried vegetables. Grains Breads made with saturated or trans fats, oils, or whole milk. Croissants. Sweet rolls. Donuts. High-fat crackers, such as cheese crackers and chips. Meats and other proteins Fatty meats, such as hot dogs, ribs, sausage, bacon, rib-eye roast or steak. High-fat deli meats, such as salami and bologna. Caviar. Domestic duck and goose. Organ meats, such as liver. Dairy Cream, sour cream, cream cheese, and creamed cottage cheese. Whole-milk cheeses. Whole or 2% milk (liquid, evaporated, or condensed). Whole buttermilk. Cream sauce or high-fat cheese sauce. Whole-milk yogurt. Fats and oils Meat fat, or shortening. Cocoa butter, hydrogenated oils, palm oil, coconut oil, palm kernel oil. Solid fats and shortenings, including bacon fat, salt pork, lard, and butter. Nondairy cream substitutes. Salad dressings with cheese or sour cream. Beverages Regular sodas and any drinks with added sugar. Sweets and desserts Frosting.  Pudding. Cookies. Cakes. Pies. Milk chocolate or white chocolate. Buttered syrups. Full-fat ice cream or ice cream drinks. The items listed above may not be a complete list of foods and beverages to avoid. Contact a dietitian for more information. Summary Heart-healthy meal planning includes limiting unhealthy  fats, increasing healthy fats, limiting salt (sodium) intake and making other diet and lifestyle changes. Lose weight if you are overweight. Losing just 5-10% of your body weight can help your overall health and prevent diseases such as diabetes and heart disease. Focus on eating a balance of foods, including fruits and vegetables, low-fat or nonfat dairy, lean protein, nuts and legumes, whole grains, and heart-healthy oils and fats. This information is not intended to replace advice given to you by your health care provider. Make sure you discuss any questions you have with your health care provider. Document Revised: 02/05/2021 Document Reviewed: 02/05/2021 Elsevier Patient Education  2024 [employer].

## 2022-10-15 NOTE — Progress Notes (Signed)
Clinical Summary Angela Mullins is a 67 y.o.female seen today for follow up of the following medical problems.    Carotid stenosis - prior ER visit with neck swelling - had a CTA that showed left internal carotid 40% stenosis - 07/2021 carotid US: <50% stenosis bilaterally      - compliant with meds - quit smoking in 2021     2. Hyperlipidemia - she is on statin, fenofibrate - crestor caused muscle weakness, changed to atorvastatin 06/2020 AST 49 ALT 61, chronically mildly elevated followed by GI - lipids followed by pcp, she reports long history of difficult to control cholesterol   05/2020 LDL 133 - she reports more recent labs with pcp, we are requesting results - last visit we added zetia 10mg  daily. -  not interested in pcsk9i       Past Medical History:  Diagnosis Date   Anxiety    Aortic atherosclerosis (HCC) 05/28/2020   GERD (gastroesophageal reflux disease)    Hypercholesteremia    Hypertension    Meningitis    Vitamin D deficiency      Allergies  Allergen Reactions   Demerol [Meperidine Hcl] Other (See Comments)    Does not help   Quinine Derivatives Nausea And Vomiting   Rosuvastatin     Other reaction(s): joint and muscle pain     Current Outpatient Medications  Medication Sig Dispense Refill   acetaminophen (TYLENOL) 325 MG tablet Take 650 mg by mouth every 6 (six) hours as needed for mild pain.     aspirin EC 81 MG tablet Take 81 mg by mouth daily. Swallow whole.     atorvastatin (LIPITOR) 80 MG tablet Take 80 mg by mouth daily.     cetirizine (ZYRTEC) 10 MG tablet Take 10 mg by mouth daily.     clonazePAM (KLONOPIN) 0.5 MG tablet Take 0.5 mg by mouth 2 (two) times daily.     diphenhydrAMINE (BENADRYL) 25 mg capsule Take 25 mg by mouth every 8 (eight) hours as needed for itching (Swelling).     eletriptan (RELPAX) 40 MG tablet Take 40 mg by mouth every 2 (two) hours as needed for migraine.     ergocalciferol (VITAMIN D2) 1.25 MG (50000 UT)  capsule Take 50,000 Units by mouth once a week.     ezetimibe (ZETIA) 10 MG tablet Take 1 tablet (10 mg total) by mouth daily. 90 tablet 1   fenofibrate micronized (LOFIBRA) 200 MG capsule Take 200 mg by mouth daily before breakfast.     HYDROcodone-acetaminophen (NORCO/VICODIN) 5-325 MG tablet Take 1 tablet by mouth every 6 (six) hours as needed for moderate pain. (Patient not taking: Reported on 04/19/2021) 20 tablet 0   ibuprofen (ADVIL) 200 MG tablet Take 400 mg by mouth daily as needed for headache.     lisinopril (ZESTRIL) 5 MG tablet Take 5 mg by mouth daily.     Multiple Vitamins-Minerals (MULTIVITAMIN WITH MINERALS) tablet Take 1 tablet by mouth daily. 50 +     omeprazole (PRILOSEC) 40 MG capsule Take 40 mg by mouth 2 (two) times daily.     No current facility-administered medications for this visit.     Past Surgical History:  Procedure Laterality Date   BIOPSY  05/23/2020   Procedure: BIOPSY;  Surgeon: Dolores Frame, MD;  [facility];  Service: Gastroenterology;;  gastric esophageal   CHOLECYSTECTOMY     COLONOSCOPY WITH PROPOFOL N/A 05/23/2020   castaneda: One 10 mm polyp in  the proximal ascending colon, Four 4 to 8 mm polyps in the descending colon, in the transverse colon and in the ascending colon (tubular adenomas), diverticulosis in the sigmoid colon. Non-bleeding internal hemorrhoids.   ESOPHAGEAL DILATION N/A 05/23/2020   Procedure: ESOPHAGEAL DILATION;  Surgeon: Dolores Frame, MD;  [facility]: [facility];  Service: Gastroenterology;  Laterality: N/A;   ESOPHAGOGASTRODUODENOSCOPY (EGD) WITH PROPOFOL N/A 05/23/2020   castaneda:No endoscopic esophageal abnormality to explain patient's dysphagia. Esophagus dilated and Biopsied (normal)   PELVIC FRACTURE SURGERY     POLYPECTOMY  05/23/2020   Procedure: POLYPECTOMY;  Surgeon: Marguerita Merles, Reuel Boom, MD;  [facility]: [facility];  Service: Gastroenterology;;  colon   WISDOM TOOTH  EXTRACTION       Allergies  Allergen Reactions   Demerol [Meperidine Hcl] Other (See Comments)    Does not help   Quinine Derivatives Nausea And Vomiting   Rosuvastatin     Other reaction(s): joint and muscle pain      Family History  Problem Relation Age of Onset   Alzheimer's disease Mother    Heart disease Father    Diabetes Sister    Heart disease Sister    Hypertension Brother      Social History Ms. Shankman reports that she has quit smoking. She has never used smokeless tobacco. Ms. Birnie reports current alcohol use.   Review of Systems CONSTITUTIONAL: No weight loss, fever, chills, weakness or fatigue.  HEENT: Eyes: No visual loss, blurred vision, double vision or yellow sclerae.No hearing loss, sneezing, congestion, runny nose or sore throat.  SKIN: No rash or itching.  CARDIOVASCULAR: per hpi RESPIRATORY: No shortness of breath, cough or sputum.  GASTROINTESTINAL: No anorexia, nausea, vomiting or diarrhea. No abdominal pain or blood.  GENITOURINARY: No burning on urination, no polyuria NEUROLOGICAL: No headache, dizziness, syncope, paralysis, ataxia, numbness or tingling in the extremities. No change in bowel or bladder control.  MUSCULOSKELETAL: No muscle, back pain, joint pain or stiffness.  LYMPHATICS: No enlarged nodes. No history of splenectomy.  PSYCHIATRIC: No history of depression or anxiety.  ENDOCRINOLOGIC: No reports of sweating, cold or heat intolerance. No polyuria or polydipsia.  Marland Kitchen   Physical Examination Today's Vitals   10/15/22 1359  BP: 108/70  Pulse: 73  SpO2: 95%  Weight: 129 lb (58.5 kg)  Height: 5\' 4"  (1.626 m)   Body mass index is 22.14 kg/m.  Gen: resting comfortably, no acute distress HEENT: no scleral icterus, pupils equal round and reactive, no palptable cervical adenopathy,  CV: RRR, no mrg, no jvd Resp: Clear to auscultation bilaterally GI: abdomen is soft, non-tender, non-distended, normal bowel sounds, no  hepatosplenomegaly MSK: extremities are warm, no edema.  Skin: warm, no rash Neuro:  no focal deficits Psych: appropriate affect    Assessment and Plan  Carotid stenosis - mild asymptomatic disease, incidental findings from CT. Prior CTs have also show evidence of aortic atherosclersosis -07/2021 carotid US mild bilateral disease - we will continue risk factor modification, repeat US next year after our appt   2. Hyperlipidemia - she reports long history of difficult to control cholesterol - did not tolerate crestor, has been on atorva 80, zetia, fenofibrate -request pcp labs. Of not she states would not have interest in pcsk9i  EKG shows NSR  F/u 1 year    Antoine Poche, M.D.

## 2022-10-15 NOTE — Telephone Encounter (Deleted)
Unable to leave a message on pt's vm d/t vm being full.

## 2022-10-15 NOTE — Telephone Encounter (Signed)
Patient notified and verbalized understanding. 

## 2022-10-15 NOTE — Telephone Encounter (Signed)
Per Dr. Wyline Mood in regards to pt's lab work from PCP:  Received her labs, cholesterol is too high. Continue to work on diet as we discussed, she is on the maximum oral regimen. Over time if further uptrend in the cholesterol may need to discuss the injection medications for high cholesterol

## 2022-11-28 ENCOUNTER — Other Ambulatory Visit: Payer: Self-pay | Admitting: Cardiology

## 2023-06-12 ENCOUNTER — Other Ambulatory Visit (HOSPITAL_COMMUNITY): Payer: Self-pay | Admitting: Nurse Practitioner

## 2023-06-12 DIAGNOSIS — N63 Unspecified lump in unspecified breast: Secondary | ICD-10-CM

## 2023-06-18 ENCOUNTER — Other Ambulatory Visit (HOSPITAL_COMMUNITY): Payer: Self-pay | Admitting: Nurse Practitioner

## 2023-06-18 ENCOUNTER — Inpatient Hospital Stay
Admission: RE | Admit: 2023-06-18 | Discharge: 2023-06-18 | Disposition: A | Discharge status: Home / Self Care | Payer: Self-pay | Source: Ambulatory Visit | Attending: Nurse Practitioner | Admitting: Nurse Practitioner

## 2023-06-18 DIAGNOSIS — N63 Unspecified lump in unspecified breast: Secondary | ICD-10-CM

## 2023-07-03 ENCOUNTER — Ambulatory Visit (HOSPITAL_COMMUNITY)
Admission: RE | Admit: 2023-07-03 | Discharge: 2023-07-03 | Disposition: A | Discharge status: Home / Self Care | Source: Ambulatory Visit | Attending: Nurse Practitioner | Admitting: Nurse Practitioner

## 2023-07-03 ENCOUNTER — Encounter (HOSPITAL_COMMUNITY): Payer: Self-pay

## 2023-07-03 DIAGNOSIS — N63 Unspecified lump in unspecified breast: Secondary | ICD-10-CM

## 2023-07-03 DIAGNOSIS — N6452 Nipple discharge: Secondary | ICD-10-CM | POA: Insufficient documentation

## 2023-07-03 DIAGNOSIS — N6324 Unspecified lump in the left breast, lower inner quadrant: Secondary | ICD-10-CM | POA: Diagnosis not present

## 2023-07-03 DIAGNOSIS — N6314 Unspecified lump in the right breast, lower inner quadrant: Secondary | ICD-10-CM | POA: Insufficient documentation

## 2023-07-14 ENCOUNTER — Other Ambulatory Visit (HOSPITAL_COMMUNITY): Payer: Self-pay | Admitting: Nurse Practitioner

## 2023-07-14 DIAGNOSIS — R928 Other abnormal and inconclusive findings on diagnostic imaging of breast: Secondary | ICD-10-CM

## 2023-07-16 ENCOUNTER — Other Ambulatory Visit (HOSPITAL_COMMUNITY): Payer: Self-pay | Admitting: Nurse Practitioner

## 2023-07-16 DIAGNOSIS — R928 Other abnormal and inconclusive findings on diagnostic imaging of breast: Secondary | ICD-10-CM

## 2023-07-17 ENCOUNTER — Ambulatory Visit (HOSPITAL_COMMUNITY)
Admission: RE | Admit: 2023-07-17 | Discharge: 2023-07-17 | Disposition: A | Discharge status: Home / Self Care | Source: Ambulatory Visit | Attending: Nurse Practitioner | Admitting: Nurse Practitioner

## 2023-07-17 ENCOUNTER — Encounter (HOSPITAL_COMMUNITY)

## 2023-07-17 ENCOUNTER — Encounter (HOSPITAL_COMMUNITY): Payer: Self-pay

## 2023-07-17 DIAGNOSIS — R928 Other abnormal and inconclusive findings on diagnostic imaging of breast: Secondary | ICD-10-CM

## 2023-07-17 HISTORY — PX: BREAST BIOPSY: SHX20

## 2023-07-17 MED ORDER — LIDOCAINE-EPINEPHRINE (PF) 1 %-1:200000 IJ SOLN
10.0000 mL | Freq: Once | INTRAMUSCULAR | Status: AC
  Administered 2023-07-17: 10 mL via INTRADERMAL

## 2023-07-17 MED ORDER — LIDOCAINE-EPINEPHRINE (PF) 1 %-1:200000 IJ SOLN
INTRAMUSCULAR | Status: AC
Start: 2023-07-17 — End: 2023-07-17
  Filled 2023-07-17: qty 30

## 2023-07-17 MED ORDER — LIDOCAINE HCL (PF) 2 % IJ SOLN
INTRAMUSCULAR | Status: AC
Start: 2023-07-17 — End: 2023-07-17
  Filled 2023-07-17: qty 20

## 2023-07-17 MED ORDER — LIDOCAINE HCL (PF) 2 % IJ SOLN
10.0000 mL | Freq: Once | INTRAMUSCULAR | Status: AC
  Administered 2023-07-17: 10 mL

## 2023-07-17 NOTE — Progress Notes (Signed)
 Patient tolerated left and right breast biopsy procedure well today and verbalized understanding of discharge instructions. Patient ambulatory to [facility] department at this time for post imagining with no acute distress noted and given ice packs to use at home. Specimens taken to lab by Lexi from ultrasound for processing.

## 2023-07-21 LAB — SURGICAL PATHOLOGY

## 2023-08-25 ENCOUNTER — Other Ambulatory Visit: Payer: Self-pay | Admitting: *Deleted

## 2023-08-25 DIAGNOSIS — N6099 Unspecified benign mammary dysplasia of unspecified breast: Secondary | ICD-10-CM

## 2023-08-26 ENCOUNTER — Other Ambulatory Visit: Payer: Self-pay

## 2023-08-26 ENCOUNTER — Ambulatory Visit (INDEPENDENT_AMBULATORY_CARE_PROVIDER_SITE_OTHER): Admitting: General Surgery

## 2023-08-26 ENCOUNTER — Encounter: Payer: Self-pay | Admitting: General Surgery

## 2023-08-26 VITALS — BP 122/68 | HR 78 | Temp 98.2°F | Resp 18 | Ht 64.0 in | Wt 130.6 lb

## 2023-08-26 DIAGNOSIS — N6099 Unspecified benign mammary dysplasia of unspecified breast: Secondary | ICD-10-CM

## 2023-08-26 NOTE — Progress Notes (Signed)
 Angela Mullins; [id number]; [birth year]   HPI Patient is a 68 year old white female who was referred to my care by Maeola Ee for evaluation and treatment of a left nipple discharge.  Patient states that she was having spontaneous clear yellow drainage from the left nipple recently.  She underwent mammography and was found to have abnormal densities behind both nipples.  Core biopsy of both those areas reveal fibrocystic changes with ductal hyperplasia.  Both biopsies were negative for malignancy.  Patient states she has had mild left nipple inversion for her whole life.  This has not changed recently.  She denies any immediate family history of breast cancer. Past Medical History:  Diagnosis Date   Anxiety    Aortic atherosclerosis (HCC) 05/28/2020   GERD (gastroesophageal reflux disease)    Hypercholesteremia    Hypertension    Meningitis    Vitamin D deficiency     Past Surgical History:  Procedure Laterality Date   BIOPSY  05/23/2020   Procedure: BIOPSY;  Surgeon: Eartha Angelia Sieving, MD;  [facility]: [facility];  Service: Gastroenterology;;  gastric esophageal   BREAST BIOPSY Left 07/17/2023   US  LT BREAST BX W LOC DEV 1ST LESION IMG BX SPEC US  GUIDE 07/17/2023 Mir, Aliene SAUNDERS, MD [facility]   BREAST BIOPSY Right 07/17/2023   US  RT BREAST BX W LOC DEV 1ST LESION IMG BX SPEC US  GUIDE 07/17/2023 Mir, Aliene SAUNDERS, MD [facility]   CHOLECYSTECTOMY     COLONOSCOPY WITH PROPOFOL  N/A 05/23/2020   castaneda: One 10 mm polyp in the proximal ascending colon, Four 4 to 8 mm polyps in the descending colon, in the transverse colon and in the ascending colon (tubular adenomas), diverticulosis in the sigmoid colon. Non-bleeding internal hemorrhoids.   ESOPHAGEAL DILATION N/A 05/23/2020   Procedure: ESOPHAGEAL DILATION;  Surgeon: Eartha Angelia Sieving, MD;  [facility];  Service: Gastroenterology;  Laterality: N/A;   ESOPHAGOGASTRODUODENOSCOPY (EGD) WITH PROPOFOL  N/A 05/23/2020    castaneda:No endoscopic esophageal abnormality to explain patient's dysphagia. Esophagus dilated and Biopsied (normal)   PELVIC FRACTURE SURGERY     POLYPECTOMY  05/23/2020   Procedure: POLYPECTOMY;  Surgeon: Eartha Angelia, Sieving, MD;  [facility];  Service: Gastroenterology;;  colon   WISDOM TOOTH EXTRACTION      Family History  Problem Relation Age of Onset   Alzheimer's disease Mother    Heart disease Father    Diabetes Sister    Heart disease Sister    Hypertension Brother     Current Outpatient Medications on File Prior to Visit  Medication Sig Dispense Refill   acetaminophen  (TYLENOL ) 325 MG tablet Take 650 mg by mouth every 6 (six) hours as needed for mild pain.     aspirin  EC 81 MG tablet Take 81 mg by mouth daily. Swallow whole.     atorvastatin  (LIPITOR) 80 MG tablet Take 80 mg by mouth daily.     cetirizine (ZYRTEC) 10 MG tablet Take 10 mg by mouth daily.     clonazePAM  (KLONOPIN ) 0.5 MG tablet Take 0.5 mg by mouth 2 (two) times daily.     diphenhydrAMINE  (BENADRYL ) 25 mg capsule Take 25 mg by mouth every 8 (eight) hours as needed for itching (Swelling).     eletriptan  (RELPAX ) 40 MG tablet Take 40 mg by mouth every 2 (two) hours as needed for migraine.     ezetimibe  (ZETIA ) 10 MG tablet TAKE ONE TABLET BY MOUTH ONCE DAILY 90 tablet 3   fenofibrate  micronized (LOFIBRA) 200 MG  capsule Take 200 mg by mouth daily before breakfast.     HYDROcodone -acetaminophen  (NORCO/VICODIN) 5-325 MG tablet Take 1 tablet by mouth every 6 (six) hours as needed for moderate pain. 20 tablet 0   ibuprofen (ADVIL) 200 MG tablet Take 400 mg by mouth daily as needed for headache.     lisinopril  (ZESTRIL ) 5 MG tablet Take 5 mg by mouth daily.     Multiple Vitamins-Minerals (MULTIVITAMIN WITH MINERALS) tablet Take 1 tablet by mouth daily. 50 +     omeprazole (PRILOSEC) 40 MG capsule Take 40 mg by mouth 2 (two) times daily.     No current facility-administered medications on file  prior to visit.    Allergies  Allergen Reactions   Demerol [Meperidine Hcl] Other (See Comments)    Does not help   Quinine Derivatives Nausea And Vomiting   Rosuvastatin     Other reaction(s): joint and muscle pain    Social History   Substance and Sexual Activity  Alcohol Use Yes   Comment: occasionally    Social History   Tobacco Use  Smoking Status Former   Passive exposure: Current  Smokeless Tobacco Never  Tobacco Comments   Quit 2021    Review of Systems  Constitutional: Negative.   HENT:  Positive for sinus pain.   Eyes: Negative.   Respiratory: Negative.    Cardiovascular: Negative.   Gastrointestinal:  Positive for heartburn.  Genitourinary: Negative.   Musculoskeletal: Negative.   Skin: Negative.   Neurological: Negative.   Endo/Heme/Allergies: Negative.   Psychiatric/Behavioral: Negative.      Objective   Vitals:   08/26/23 1436  BP: 122/68  Pulse: 78  Resp: 18  Temp: 98.2 F (36.8 C)  SpO2: 92%    Physical Exam Vitals reviewed.  Constitutional:      Appearance: Normal appearance. She is normal weight. She is not ill-appearing.  HENT:     Head: Normocephalic and atraumatic.  Cardiovascular:     Rate and Rhythm: Normal rate and regular rhythm.     Heart sounds: Normal heart sounds. No murmur heard.    No friction rub. No gallop.  Pulmonary:     Effort: Pulmonary effort is normal. No respiratory distress.     Breath sounds: Normal breath sounds. No stridor. No wheezing, rhonchi or rales.  Musculoskeletal:     Cervical back: Normal range of motion and neck supple.  Lymphadenopathy:     Cervical: No cervical adenopathy.  Skin:    General: Skin is warm and dry.  Neurological:     Mental Status: She is alert and oriented to person, place, and time.   Breast: No dominant mass, nipple discharge, or dimpling noted in either breast.  Mild left nipple inversion noted.  Both axillas negative for palpable nodes.  MRI, ultrasound, and  surgical pathology results all reviewed  Assessment  Benign ductal hyperplasia, bilateral retroareolar regions. Plan  No need for surgical intervention at the present time.  Patient was told to get a follow-up mammogram in 1 year.  Should she feel a lump or noticed any change in either breast, she was instructed to return to my care for evaluation.  Follow-up here as needed.

## 2023-11-18 ENCOUNTER — Ambulatory Visit: Attending: Cardiology | Admitting: Cardiology

## 2023-11-18 ENCOUNTER — Encounter: Payer: Self-pay | Admitting: Cardiology

## 2023-11-18 VITALS — BP 125/78 | HR 77 | Ht 64.0 in | Wt 127.0 lb

## 2023-11-18 DIAGNOSIS — E782 Mixed hyperlipidemia: Secondary | ICD-10-CM

## 2023-11-18 DIAGNOSIS — I6523 Occlusion and stenosis of bilateral carotid arteries: Secondary | ICD-10-CM

## 2023-11-18 NOTE — Progress Notes (Signed)
 Clinical Summary Angela Mullins is a 68 y.o.female seen today for follow up of the following medical problems.    Carotid stenosis - prior ER visit with neck swelling - had a CTA that showed left internal carotid 40% stenosis - 07/2021 carotid US : <50% stenosis bilaterally - quit smoking in 2021 - due for repeat US      2. Hyperlipidemia - she is on statin, fenofibrate  - crestor caused muscle weakness, changed to atorvastatin  - reports recent labs with pcp in July, repeat pending for December       Past Medical History:  Diagnosis Date   Anxiety    Aortic atherosclerosis 05/28/2020   GERD (gastroesophageal reflux disease)    Hypercholesteremia    Hypertension    Meningitis    Vitamin D deficiency      Allergies  Allergen Reactions   Demerol [Meperidine Hcl] Other (See Comments)    Does not help   Quinine Derivatives Nausea And Vomiting   Rosuvastatin     Other reaction(s): joint and muscle pain     Current Outpatient Medications  Medication Sig Dispense Refill   acetaminophen  (TYLENOL ) 325 MG tablet Take 650 mg by mouth every 6 (six) hours as needed for mild pain.     aspirin  EC 81 MG tablet Take 81 mg by mouth daily. Swallow whole.     atorvastatin  (LIPITOR) 80 MG tablet Take 80 mg by mouth daily.     cetirizine (ZYRTEC) 10 MG tablet Take 10 mg by mouth daily.     clonazePAM  (KLONOPIN ) 0.5 MG tablet Take 0.5 mg by mouth 2 (two) times daily.     diphenhydrAMINE  (BENADRYL ) 25 mg capsule Take 25 mg by mouth every 8 (eight) hours as needed for itching (Swelling).     eletriptan  (RELPAX ) 40 MG tablet Take 40 mg by mouth every 2 (two) hours as needed for migraine.     ezetimibe  (ZETIA ) 10 MG tablet TAKE ONE TABLET BY MOUTH ONCE DAILY 90 tablet 3   fenofibrate  micronized (LOFIBRA) 200 MG capsule Take 200 mg by mouth daily before breakfast.     HYDROcodone -acetaminophen  (NORCO/VICODIN) 5-325 MG tablet Take 1 tablet by mouth every 6 (six) hours as needed for moderate  pain. 20 tablet 0   ibuprofen (ADVIL) 200 MG tablet Take 400 mg by mouth daily as needed for headache.     lisinopril  (ZESTRIL ) 5 MG tablet Take 5 mg by mouth daily.     Multiple Vitamins-Minerals (MULTIVITAMIN WITH MINERALS) tablet Take 1 tablet by mouth daily. 50 +     omeprazole (PRILOSEC) 40 MG capsule Take 40 mg by mouth 2 (two) times daily.     No current facility-administered medications for this visit.     Past Surgical History:  Procedure Laterality Date   BIOPSY  05/23/2020   Procedure: BIOPSY;  Surgeon: Angela Angelia Sieving, MD;  [facility];  Service: Gastroenterology;;  gastric esophageal   BREAST BIOPSY Left 07/17/2023   US  LT BREAST BX W LOC DEV 1ST LESION IMG BX SPEC US  GUIDE 07/17/2023 Angela Mullins, Angela SAUNDERS, MD [facility]   BREAST BIOPSY Right 07/17/2023   US  RT BREAST BX W LOC DEV 1ST LESION IMG BX SPEC US  GUIDE 07/17/2023 Angela Mullins, Angela SAUNDERS, MD [facility]   CHOLECYSTECTOMY     COLONOSCOPY WITH PROPOFOL  N/A 05/23/2020   castaneda: One 10 mm polyp in the proximal ascending colon, Four 4 to 8 mm polyps in the descending colon, in the transverse colon and in the  ascending colon (tubular adenomas), diverticulosis in the sigmoid colon. Non-bleeding internal hemorrhoids.   ESOPHAGEAL DILATION N/A 05/23/2020   Procedure: ESOPHAGEAL DILATION;  Surgeon: Angela Angelia Sieving, MD;  [facility];  Service: Gastroenterology;  Laterality: N/A;   ESOPHAGOGASTRODUODENOSCOPY (EGD) WITH PROPOFOL  N/A 05/23/2020   castaneda:No endoscopic esophageal abnormality to explain patient's dysphagia. Esophagus dilated and Biopsied (normal)   PELVIC FRACTURE SURGERY     POLYPECTOMY  05/23/2020   Procedure: POLYPECTOMY;  Surgeon: Angela Angelia, Sieving, MD;  [facility];  Service: Gastroenterology;;  colon   WISDOM TOOTH EXTRACTION       Allergies  Allergen Reactions   Demerol [Meperidine Hcl] Other (See Comments)    Does not help   Quinine Derivatives  Nausea And Vomiting   Rosuvastatin     Other reaction(s): joint and muscle pain      Family History  Problem Relation Age of Onset   Alzheimer's disease Mother    Heart disease Father    Diabetes Sister    Heart disease Sister    Hypertension Brother      Social History [name] reports that she has quit smoking. She has been exposed to tobacco smoke. She has never used smokeless tobacco. [name] reports current alcohol use.     Physical Examination Today's Vitals   11/18/23 1338  BP: 125/78  Pulse: 77  SpO2: 99%  Weight: 127 lb (57.6 kg)  Height: 5' 4 (1.626 m)   Body mass index is 21.8 kg/m.  Gen: resting comfortably, no acute distress HEENT: no scleral icterus, pupils equal round and reactive, no palptable cervical adenopathy,  CV: RRR, no m/rg, no jvd Resp: Clear to auscultation bilaterally GI: abdomen is soft, non-tender, non-distended, normal bowel sounds, no hepatosplenomegaly MSK: extremities are warm, no edema.  Skin: warm, no rash Neuro:  no focal deficits Psych: appropriate affect   Diagnostic Studies    Assessment and Plan   Carotid stenosis - mild asymptomatic disease, incidental findings from CT. Prior CTs have also show evidence of aortic atherosclersosis -07/2021 carotid US  mild bilateral disease - repeat carotid US  - continue medical therpay   2. Hyperlipidemia - did not tolerate crestor, has been on atorva 80, zetia , fenofibrate  -we will request pcp labs    EKG today shows NSR     Dorn PHEBE Ross, M.D.

## 2023-11-18 NOTE — Patient Instructions (Signed)
 Medication Instructions:  Your physician recommends that you continue on your current medications as directed. Please refer to the Current Medication list given to you today.  *If you need a refill on your cardiac medications before your next appointment, please call your pharmacy*  Lab Work: None If you have labs (blood work) drawn today and your tests are completely normal, you will receive your results only by: MyChart Message (if you have MyChart) OR A paper copy in the mail If you have any lab test that is abnormal or we need to change your treatment, we will call you to review the results.  Testing/Procedures: Your physician has requested that you have a carotid duplex. This test is an ultrasound of the carotid arteries in your neck. It looks at blood flow through these arteries that supply the brain with blood. Allow one hour for this exam. There are no restrictions or special instructions.   Follow-Up: At [facility], you and your health needs are our priority.  As part of our continuing mission to provide you with exceptional heart care, our providers are all part of one team.  This team includes your primary Cardiologist (physician) and Advanced Practice Providers or APPs (Physician Assistants and Nurse Practitioners) who all work together to provide you with the care you need, when you need it.  Your next appointment:   6 month(s)  Provider:   You may see Alvan Carrier, MD or one of the following Advanced Practice Providers on your designated Care Team:   Laymon Qua, PA-C  [place], [place] Olivia Pavy, [place]     We recommend signing up for the patient portal called MyChart.  Sign up information is provided on this After Visit Summary.  MyChart is used to connect with patients for Virtual Visits (Telemedicine).  Patients are able to view lab/test results, encounter notes, upcoming appointments, etc.  Non-urgent messages can be sent to your provider as  well.   To learn more about what you can do with MyChart, go to [email or web address].   Other Instructions

## 2023-11-26 ENCOUNTER — Ambulatory Visit (HOSPITAL_COMMUNITY)
Admission: RE | Admit: 2023-11-26 | Discharge: 2023-11-26 | Disposition: A | Discharge status: Home / Self Care | Source: Ambulatory Visit | Attending: Cardiology | Admitting: Cardiology

## 2023-11-26 DIAGNOSIS — I6523 Occlusion and stenosis of bilateral carotid arteries: Secondary | ICD-10-CM | POA: Diagnosis present

## 2023-12-01 ENCOUNTER — Ambulatory Visit: Payer: Self-pay | Admitting: Cardiology

## 2023-12-02 NOTE — Telephone Encounter (Signed)
 Patient returned staff call regarding results.
